# Patient Record
Sex: Male | Born: 1975 | Race: White | Hispanic: No | Marital: Married | Smoking: Current some day smoker
Health system: Southern US, Community
[De-identification: ages and names within clinical notes are randomized; demographics above are authoritative.]

## PROBLEM LIST (undated history)

## (undated) DIAGNOSIS — H2010 Chronic iridocyclitis, unspecified eye: Secondary | ICD-10-CM

## (undated) DIAGNOSIS — E7800 Pure hypercholesterolemia, unspecified: Secondary | ICD-10-CM

## (undated) DIAGNOSIS — I10 Essential (primary) hypertension: Secondary | ICD-10-CM

## (undated) DIAGNOSIS — J302 Other seasonal allergic rhinitis: Secondary | ICD-10-CM

## (undated) DIAGNOSIS — K219 Gastro-esophageal reflux disease without esophagitis: Secondary | ICD-10-CM

## (undated) HISTORY — PX: APPENDECTOMY: SHX54

## (undated) HISTORY — PX: KNEE SURGERY: SHX244

---

## 1999-06-16 ENCOUNTER — Emergency Department (HOSPITAL_COMMUNITY): Admission: EM | Admit: 1999-06-16 | Discharge: 1999-06-17 | Payer: Self-pay

## 1999-09-20 ENCOUNTER — Encounter (INDEPENDENT_AMBULATORY_CARE_PROVIDER_SITE_OTHER): Payer: Self-pay | Admitting: Specialist

## 1999-09-20 ENCOUNTER — Inpatient Hospital Stay (HOSPITAL_COMMUNITY): Admission: EM | Admit: 1999-09-20 | Discharge: 1999-09-21 | Payer: Self-pay | Admitting: Emergency Medicine

## 1999-09-20 ENCOUNTER — Encounter: Payer: Self-pay | Admitting: Emergency Medicine

## 2000-05-13 ENCOUNTER — Emergency Department (HOSPITAL_COMMUNITY): Admission: EM | Admit: 2000-05-13 | Discharge: 2000-05-14 | Payer: Self-pay | Admitting: Emergency Medicine

## 2000-05-14 ENCOUNTER — Encounter: Payer: Self-pay | Admitting: Emergency Medicine

## 2001-06-30 ENCOUNTER — Emergency Department (HOSPITAL_COMMUNITY): Admission: EM | Admit: 2001-06-30 | Discharge: 2001-06-30 | Payer: Self-pay | Admitting: Emergency Medicine

## 2001-06-30 ENCOUNTER — Encounter: Payer: Self-pay | Admitting: Emergency Medicine

## 2002-12-23 ENCOUNTER — Emergency Department (HOSPITAL_COMMUNITY): Admission: EM | Admit: 2002-12-23 | Discharge: 2002-12-23 | Payer: Self-pay | Admitting: Emergency Medicine

## 2003-01-07 ENCOUNTER — Ambulatory Visit (HOSPITAL_BASED_OUTPATIENT_CLINIC_OR_DEPARTMENT_OTHER): Admission: RE | Admit: 2003-01-07 | Discharge: 2003-01-07 | Payer: Self-pay | Admitting: Orthopedic Surgery

## 2003-06-21 ENCOUNTER — Emergency Department (HOSPITAL_COMMUNITY): Admission: EM | Admit: 2003-06-21 | Discharge: 2003-06-21 | Payer: Self-pay | Admitting: Family Medicine

## 2003-08-28 ENCOUNTER — Inpatient Hospital Stay (HOSPITAL_COMMUNITY): Admission: EM | Admit: 2003-08-28 | Discharge: 2003-09-03 | Payer: Self-pay | Admitting: *Deleted

## 2003-10-16 ENCOUNTER — Emergency Department (HOSPITAL_COMMUNITY): Admission: EM | Admit: 2003-10-16 | Discharge: 2003-10-16 | Payer: Self-pay | Admitting: Emergency Medicine

## 2004-03-26 ENCOUNTER — Emergency Department (HOSPITAL_COMMUNITY): Admission: EM | Admit: 2004-03-26 | Discharge: 2004-03-26 | Payer: Self-pay | Admitting: Family Medicine

## 2004-08-19 ENCOUNTER — Emergency Department (HOSPITAL_COMMUNITY): Admission: EM | Admit: 2004-08-19 | Discharge: 2004-08-19 | Payer: Self-pay | Admitting: Emergency Medicine

## 2007-02-06 ENCOUNTER — Emergency Department (HOSPITAL_COMMUNITY): Admission: EM | Admit: 2007-02-06 | Discharge: 2007-02-06 | Payer: Self-pay | Admitting: Emergency Medicine

## 2009-02-08 ENCOUNTER — Emergency Department (HOSPITAL_COMMUNITY): Admission: EM | Admit: 2009-02-08 | Discharge: 2009-02-08 | Payer: Self-pay | Admitting: Emergency Medicine

## 2009-04-02 ENCOUNTER — Emergency Department (HOSPITAL_COMMUNITY): Admission: EM | Admit: 2009-04-02 | Discharge: 2009-04-02 | Payer: Self-pay | Admitting: Emergency Medicine

## 2009-10-25 ENCOUNTER — Emergency Department (HOSPITAL_COMMUNITY): Admission: EM | Admit: 2009-10-25 | Discharge: 2009-10-25 | Payer: Self-pay | Admitting: Emergency Medicine

## 2009-10-27 ENCOUNTER — Ambulatory Visit: Payer: Self-pay | Admitting: Internal Medicine

## 2009-10-27 DIAGNOSIS — K219 Gastro-esophageal reflux disease without esophagitis: Secondary | ICD-10-CM | POA: Insufficient documentation

## 2009-10-27 DIAGNOSIS — R109 Unspecified abdominal pain: Secondary | ICD-10-CM | POA: Insufficient documentation

## 2009-10-27 DIAGNOSIS — R51 Headache: Secondary | ICD-10-CM

## 2009-10-27 DIAGNOSIS — R519 Headache, unspecified: Secondary | ICD-10-CM | POA: Insufficient documentation

## 2009-10-27 DIAGNOSIS — I10 Essential (primary) hypertension: Secondary | ICD-10-CM | POA: Insufficient documentation

## 2009-10-27 DIAGNOSIS — Z87442 Personal history of urinary calculi: Secondary | ICD-10-CM

## 2010-03-07 NOTE — Assessment & Plan Note (Signed)
Summary: NEW PT TO ESTABLISH WITH ABDOMINAL PAIN//SLM   Vital Signs:  Patient profile:   35 year old male Height:      73.5 inches Weight:      267 pounds BMI:     34.87 Temp:     98.8 degrees F oral Pulse rate:   84 / minute Pulse rhythm:   regular Resp:     20 per minute BP sitting:   150 / 122  (left arm) Cuff size:   large  Vitals Entered By: Duard Brady LPN (October 27, 2009 2:08 PM) CC: new to establish - BP issues Is Patient Diabetic? No   CC:  new to establish - BP issues.  History of Present Illness: 35 year old patient who is seen today to establish with a practice.  Four days ago.  He was seen at an urgent care center due to lower abdominal pain.  He states that he is completing two antibiotics at this time.  Two days ago, [facility] to persistent lower abdominal [facility].  He is status post remote appendectomy at age 17.  Earlier today.  He states that the pain has improved.  Denies any nausea, vomiting, or fever. He does have a long history of hypertension, but has not been taking his medication regularly due to the abdominal pain.  He also has a history of kidney stones.  He states it is also followed by ophthalmology due to iritis or uveitis  Preventive Screening-Counseling & Management  Alcohol-Tobacco     Smoking Cessation Counseling: yes  Allergies (verified): No Known Drug Allergies  Past History:  Past Medical History: GERD Headache Hypertension Nephrolithiasis, hx of history of uveitis or iritis  Past Surgical History: appendectomy age 57  right knee arthroscopic surgery  Family History: Reviewed history and no changes required. father mid-60s with hypertension mother early 44s with type 2 diabetes  one half brother in good health one sister stepsister  Social History: Reviewed history and no changes required. 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  The patient denies anorexia, fever, weight loss, weight gain, vision loss, decreased hearing, hoarseness, chest pain, syncope, dyspnea on exertion, peripheral edema, prolonged cough, headaches, hemoptysis, melena, hematochezia, severe indigestion/heartburn, hematuria, incontinence, genital sores, muscle weakness, suspicious skin lesions, transient blindness, difficulty walking, depression, unusual weight change, abnormal bleeding, enlarged lymph nodes, angioedema, breast masses, and testicular masses.    Physical Exam  General:  overweight-appearing.  160/110.  No acute distress Head:  Normocephalic and atraumatic without obvious abnormalities. No apparent alopecia or balding. Eyes:  No corneal or conjunctival inflammation noted. EOMI. Perrla. Funduscopic exam benign, without hemorrhages, exudates or papilledema. Vision grossly normal. Ears:  External ear exam shows no significant lesions or deformities.  Otoscopic examination reveals clear canals, tympanic membranes are intact bilaterally without bulging, retraction, inflammation or discharge. Hearing is grossly normal bilaterally. Nose:  External nasal examination shows no deformity or inflammation. Nasal mucosa are pink and moist without lesions or exudates. Mouth:  Oral mucosa and oropharynx without lesions or exudates.  Teeth in good repair. Neck:  No deformities, masses, or tenderness noted. Chest Wall:  No deformities, masses, tenderness or gynecomastia noted. Breasts:  No masses or gynecomastia noted Lungs:  Normal respiratory effort, chest expands symmetrically. Lungs are clear to auscultation, no crackles or wheezes. Heart:  Normal rate  and regular rhythm. S1 and S2 normal without gallop, murmur, click, rub or other extra sounds. Abdomen:  Bowel sounds positive,abdomen soft and non-tender without masses, organomegaly or hernias noted. very mild tenderness in  the lower abdominal quadrants without guarding Genitalia:  Testes bilaterally descended without nodularity, tenderness or masses. No scrotal masses or lesions. No penis lesions or urethral discharge. Msk:  No deformity or scoliosis noted of thoracic or lumbar spine.   Pulses:  R and L carotid,radial,femoral,dorsalis pedis and posterior tibial pulses are full and equal bilaterally Extremities:  No clubbing, cyanosis, edema, or deformity noted with normal full range of motion of all joints.   Neurologic:  No cranial nerve deficits noted. Station and gait are normal. Plantar reflexes are down-going bilaterally. DTRs are symmetrical throughout. Sensory, motor and coordinative functions appear intact. Skin:  Intact without suspicious lesions or rashes Cervical Nodes:  No lymphadenopathy noted Axillary Nodes:  No palpable lymphadenopathy Inguinal Nodes:  No significant adenopathy Psych:  Cognition and judgment appear intact. Alert and cooperative with normal attention span and concentration. No apparent delusions, illusions, hallucinations   Impression & Recommendations:  Problem # 1:  ABDOMINAL PAIN (ICD-789.00) the patient is much improved today; will clinically observed and placed on Align one daily for two weeks  Problem # 2:  GERD (ICD-530.81)  His updated medication list for this problem includes:    Prilosec Otc 20 Mg Tbec (Omeprazole magnesium) ..... Qd  Problem # 3:  HYPERTENSION (ICD-401.9)  His updated medication list for this problem includes:    Lisinopril-hydrochlorothiazide 20-25 Mg Tabs (Lisinopril-hydrochlorothiazide) ..... One daily compliance stressed.  Will reassess in  3 weeks; home blood pressure monitoring encouraged  Complete Medication List: 1)  Htn Med - Pt Does Not Know Name  2)  Prilosec Otc 20 Mg Tbec (Omeprazole magnesium) .... Qd 3)  Otc Allergy Med - Does Not Know Name  4)  2 Abx - Does Not Know Name  5)  Vicodin  6)  Lisinopril-hydrochlorothiazide 20-25  Mg Tabs (Lisinopril-hydrochlorothiazide) .... One daily  Patient Instructions: 1)  Please schedule a follow-up appointment in 2 weeks. 2)  Limit your Sodium (Salt). 3)  Tobacco is very bad for your health and your loved ones! You Should stop smoking!. 4)  It is important that you exercise regularly at least 20 minutes 5 times a week. If you develop chest pain, have severe difficulty breathing, or feel very tired , stop exercising immediately and seek medical attention. 5)  You need to lose weight. Consider a lower calorie diet and regular exercise.  6)  Check your Blood Pressure regularly. If it is above:  160/90 you should make an appointment. Prescriptions: LISINOPRIL-HYDROCHLOROTHIAZIDE 20-25 MG TABS (LISINOPRIL-HYDROCHLOROTHIAZIDE) one daily  #90 x 4   Entered and Authorized by:   Gordy Savers  MD   Signed by:   Gordy Savers  MD on 10/27/2009   Method used:   Electronically to        [facility]  [phone]* (retail)       [address]       [place], [place]  [address]       Ph: [phone] or [phone]       Fax: [phone]   RxID:   [id number]

## 2010-04-20 LAB — URINALYSIS, ROUTINE W REFLEX MICROSCOPIC
Bilirubin Urine: NEGATIVE
Glucose, UA: NEGATIVE mg/dL
Hgb urine dipstick: NEGATIVE
Ketones, ur: NEGATIVE mg/dL
Nitrite: NEGATIVE
Protein, ur: NEGATIVE mg/dL
Specific Gravity, Urine: 1.018 (ref 1.005–1.030)
Urobilinogen, UA: 0.2 mg/dL (ref 0.0–1.0)
pH: 6.5 (ref 5.0–8.0)

## 2010-04-20 LAB — GLUCOSE, CAPILLARY: Glucose-Capillary: 142 mg/dL — ABNORMAL HIGH (ref 70–99)

## 2010-04-20 LAB — POCT I-STAT, CHEM 8
Creatinine, Ser: 1.1 mg/dL (ref 0.4–1.5)
HCT: 56 % — ABNORMAL HIGH (ref 39.0–52.0)
Hemoglobin: 19 g/dL — ABNORMAL HIGH (ref 13.0–17.0)
Potassium: 3.9 mEq/L (ref 3.5–5.1)
Sodium: 136 mEq/L (ref 135–145)
TCO2: 32 mmol/L (ref 0–100)

## 2010-06-23 NOTE — H&P (Signed)
NAME:  Philip Cox, Philip Cox                          ACCOUNT NO.:  [id number]   MEDICAL RECORD NO.:  [id number]                   PATIENT TYPE:  IPS   LOCATION:  0305                                 FACILITY:  [facility]   PHYSICIAN:  Jasmine Pang, M.D.              DATE OF BIRTH:  Nov 23, 1975   DATE OF ADMISSION:  08/28/2003  DATE OF DISCHARGE:                         PSYCHIATRIC ADMISSION ASSESSMENT   IDENTIFYING INFORMATION:  This is a voluntary admission.  This is a 35-year-  old married white male. Apparently he presented to the emergency room at  [facility] on July 22.  At the time, he was reporting  suicidal and homicidal ideation stemming from this poly substance drug  abuse.  He was medically cleared.  A bed was not available on our unit at  the time and he decided to wait for a bed to be available here.  Apparently  he lost his job last Sunday due to his polysubstance abuse.  He has been  having marital conflict as well.  He states today that he has been spending  too much money and he needs to stop.  He denies any underlying depressive  symptoms.  He states that after surgery on his right knee 7-10 months ago he  liked Percocet and he continued to buy it from the street.   PAST PSYCHIATRIC HISTORY:  He has no prior inpatient or outpatient history.   SOCIAL HISTORY:  He finished the 9th grade.  He has been employed as a  Leisure centre manager.  He has been married once for 5 years and he has one son  age 36.   FAMILY HISTORY:  He denies.   ALCOHOL AND DRUG ABUSE:  He smokes 1 pack a day.  He began drinking wine  about age 17 or 67 and he reports drinking 1 bottle of wine almost daily for  the past year.  Crack cocaine began at age 63.  He uses $40-$50 worth 4-5  times a week for the past year.  Opiates in the past year 4-5 pills 2 to 3  times a day and this has been going on again for about a year, and benzos.  He began using those 4-5 days ago, again 4-5 pills 2-3 times  a day, and he  has used that as available.  Longest sobriety was for about a year and he  has been using since age 57.   PAST MEDICAL HISTORY:  He does not have one.  He says he does have a  Medicaid card and he is waiting for a Ola Fawver.  In the emergency room they  felt that he might have acid reflux and they prescribed Nexium.   ALLERGIES:  No known drug allergies.   POSITIVE PHYSICAL FINDINGS:  PHYSICAL EXAMINATION:  As per the documentation  on the chart from the emergency room, but notably he is status post  appendectomy.  He did have knee surgery on the right, and he has had kidney  stones in the past.   MENTAL STATUS EXAM:  He is alert and oriented x3.  His behavior and  appearance:  He is neatly groomed and dressed.  His speech is normal rate,  rhythm and tone.  His mood is euthymic.  His affect is congruent.  Interestingly, he does not exhibit any symptoms for withdrawal.  Thought  processes are clear, rational and goal oriented.  Judgment and insight are  fair.  Concentration and memory are intact.  His intelligence is average.  He specifically denies suicidal or homicidal ideation and he has auditory or  visual or tactile hallucinations   ADMISSION DIAGNOSES:   AXIS I:  Polysubstance dependence.   AXIS II:  Deferred.   AXIS III:  Acid reflux.   AXIS IV:  Severe, occupational, economic, and problems related to the legal  system.  He has served time in jail previously for breaking and entering and  larceny.   PLAN:  Admit for stabilization and detox.  He was started on the Wytensin  and low-dose Librium protocol last evening.  We will continue to assess for  underlying need for an antidepressant.     Mickie Leonarda Salon, P.A.-C.               Jasmine Pang, M.D.    MD/MEDQ  D:  08/29/2003  T:  08/29/2003  Job:  [id number] [id number]

## 2010-06-23 NOTE — Op Note (Signed)
[name]  Patient:    [name], Philip Cox                       MRN: [id number] Proc. Date: 09/20/99 Adm. Date:  [id number] Attending:  Arlis Porta                           Operative Report  PREOPERATIVE DIAGNOSIS:  Acute appendicitis.  POSTOPERATIVE DIAGNOSIS:  Acute appendicitis.  PROCEDURE:  Laparoscopic appendectomy.  SURGEON:  Adolph Pollack, M.D.  ASSISTANT:  None.  ANESTHESIA:  General.  INDICATIONS:  This 35 year old male has had persistent right lower quadrant pain.  He was seen by the emergency department physician and noted to have some right lower quadrant tenderness and elevation of his white blood cell count.  A CT scan was suspicious for early acute appendicitis, and he is brought to the operating room.  DESCRIPTION OF PROCEDURE:  He is placed supine on the operating room table and a general anesthetic was administered.  The lower abdomen was shaved.  A Foley catheter was placed in the bladder.  His abdomen was then sterilely prepped and draped.  A subumbilical incision was made, incising the skin sharply, and then carrying this through subcutaneous tissue bluntly until the midline fascia was identified.  A 1.0 cm incision was made in the midline fascia.  The peritoneal cavity was entered bluntly and under direct vision.  A pursestring suture of #0 Vicryl was placed around the fascial edges.  The Hasson trocar was introduced into the peritoneal cavity.  The pneumoperitoneum created by insufflation of CO2 gas.  Next the laparoscope was introduced.  The appendix appeared to be fairly firm and edematous, and there were areas of injection in the midportion.  Two 5.0 mm trocars were placed into the left lower quadrant, one in the right upper quadrant.  The mesoappendix was grasped and the appendix was retracted anteriorly.  The mesoappendix was divided with a Harmonic scalpel.  The appendix was then removed from the  abdominal cavity.  The area was inspected and there was some bleeding from the staple line.  I very carefully used cautery and then hemoclips to control this.  I irrigated the right lower quadrant area with saline and evacuated most of it.  Next, I went ahead and removed the trocars and released the pneumoperitoneum. The subumbilical fascial defect was closed by tightening up and tying down the pursestring suture.  The skin incisions were then closed with #4-0 Monocryl subcuticular stitches, followed by Steri-Strips and sterile dressings.  He tolerated the procedure well without any apparent complications, and was taken to the recovery room in satisfactory condition. DD:  09/20/99 TD:  09/20/99 Job: [id number]

## 2010-06-23 NOTE — Op Note (Signed)
NAME:  Philip Cox, Philip Cox                          ACCOUNT NO.:  [id number]   MEDICAL RECORD NO.:  [id number]                   PATIENT TYPE:  AMB   LOCATION:  DSC                                  FACILITY:  [facility]   PHYSICIAN:  Loreta Ave, M.D.              DATE OF BIRTH:  1975/09/30   DATE OF PROCEDURE:  01/07/2003  DATE OF DISCHARGE:                                 OPERATIVE REPORT   PREOPERATIVE DIAGNOSIS:  Displaced bucket-handle tear, lateral meniscus with  locked knee, right.   POSTOPERATIVE DIAGNOSIS:  Displaced bucket-handle tear, lateral meniscus  with locked knee, right.  Chondromalacia lateral tibial plateau and patella.   PROCEDURE:  Right knee examination under anesthesia, arthroscopy,  chondroplasty of patella and lateral tibial plateau. Extensive partial  lateral meniscectomy.   SURGEON:  Loreta Ave, M.D.   ASSISTANT:  Arlys John D. Petrarca, P.A.-C.   ANESTHESIA:  General.   ESTIMATED BLOOD LOSS:  Minimal.   SPECIMENS:  None.   CULTURES:  None.   COMPLICATIONS:  None.   DRESSINGS:  Soft compressive.   DESCRIPTION OF PROCEDURE:  The patient was brought to the operating room and  placed on the operating table in the supine position.  After adequate  anesthesia had been obtained, right knee examined, soft block to full  extension.  Otherwise, good stability.  Tourniquet and leg holder applied,  leg prepped and draped in the usual sterile fashion. Three portals created;  1 superolateral, 1 each medial and lateral parapatellar, inflow catheter  introduced, standard arthroscope introduced, knee inspected.   Grade 2 and 3 chondromalacia beak of patella, debrided with a shaver to a  stable surface. Trochlea looked good and there was good tracking.  Medial  meniscus, medial compartment intact.  Cruciate ligaments intact, reactive  synovitis debrided.  Lateral compartment grade 2 changes on the plateau  debrided. Bucket-handle tear, posterior 2/3rds lateral  meniscus, chronic in  nature with intermeniscal tearing, displaced anteriorly, assessed and not  repairable.  The bucket-handle fragment removed and the margins of the  meniscus tapered in.  Most of the posterior half was removed, along with the  bucket-handle tear. Care was taken to remove remnants of the posterior horn  and the remaining meniscus tapered in smoothly __________ the anterior half.   At completion, the entire knee examined, no other findings appreciated.  Instruments and fluid removed.  Portals of knee injected with Marcaine.  Portals closed with 4-0 nylon. Sterile, compressive dressing was applied,  anesthesia was reversed, brought to the recovery room, tolerated surgery  well, no complications.                                               Loreta Ave, M.D.    DFM/MEDQ  D:  01/07/2003  T:  01/08/2003  Job:  [id number]

## 2010-06-23 NOTE — Discharge Summary (Signed)
NAME:  TYUS, KALLAM NO.:  [id number]   MEDICAL RECORD NO.:  [id number]                   PATIENT TYPE:  IPS   LOCATION:  0301                                 FACILITY:  [facility]   PHYSICIAN:  Jeanice Lim, M.D.              DATE OF BIRTH:  05-16-1975   DATE OF ADMISSION:  08/28/2003  DATE OF DISCHARGE:  09/03/2003                                 DISCHARGE SUMMARY   IDENTIFYING DATA:  This is a 35 year old married Caucasian male presenting  with suicidal and homicidal ideation from polysubstance abuse.  Medically  cleared.  Waited for bed to be available here.  Had lost his job last  Sunday.  Had marital conflict as well.  Spending too much money.   ALLERGIES:  No known drug allergies.   PHYSICAL EXAMINATION:  Within normal limits.  Neurologically nonfocal.   MEDICAL HISTORY:  Status post appendectomy, kidney stones, knee surgery,  history of acid reflux.   LABORATORY DATA:  Urine drug screen positive for benzodiazepines and  cocaine.  Alcohol level less than 5.  Otherwise, within normal limits.   MENTAL STATUS EXAM:  Alert, oriented, neatly groomed, dressed.  Speech  within normal limits.  Mood euthymic.  Affect congruent.  Thought processes  goal directed, rational.  No suicidal or homicidal ideation.  No psychotic  symptoms.  Cognition intact.  Judgment and insight fair.   ADMISSION DIAGNOSES:   AXIS I:  1. Polysubstance abuse.  2. 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   AXIS II:  Deferred.   AXIS III:  Acid reflux.   AXIS IV:  Severe (occupational problems, economic problems, problems related  to legal system).   AXIS V:  45/55.   HOSPITAL COURSE:  The patient was admitted and ordered routine p.r.n.  medications and underwent further monitoring.  Was encouraged to participate  in individual, group and milieu therapy.  The patient was considered for a  Wytensin and low dose Librium detox from opiates and benzodiazepines.   The  patient had been using opiates, taking many pills daily, and alcohol, up to  a case of beer per day.  Drug of choice was cocaine.  Had been also taking  Xanax and Klonopin, having polysubstance abuse for a year now.  History for  a detox previously.  Was living with wife and child.  Had some cravings.  Fleeting suicidal thoughts and tolerated detox without complications.  Reporting improvement in mood.  Decrease in withdrawal symptoms.  Resolution  of dangerous ideation.  Improvement in mood and motivation to be abstinent  and follow up with the aftercare plan and participate in aftercare planning.   CONDITION ON DISCHARGE:  He was discharged in improved condition with no  mood instability and no acute withdrawal symptoms.  No dangerous ideation.  No psychotic symptoms.  Given medication education.   DISCHARGE MEDICATIONS:  Protonix 40 mg q.a.m.   FOLLOW UP:  The patient was to follow up with the [facility], walk-in  Monday through Friday 9 a.m. to 5 p.m.  Attend regular NA and AA meetings  and follow up for an outpatient assessment.   DISCHARGE DIAGNOSES:   AXIS I:  1. Polysubstance abuse.  2. 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   AXIS II:  Deferred.   AXIS III:  Acid reflux.   AXIS IV:  Severe (occupational problems, economic problems, problems related  to legal system).   AXIS V:  Global Assessment of Functioning on discharge 55.                                               Jeanice Lim, M.D.    JEM/MEDQ  D:  09/26/2003  T:  09/26/2003  Job:  [id number]

## 2010-06-23 NOTE — H&P (Signed)
[place]  Patient:    Philip Cox, Philip Cox                       MRN: [id number] Adm. Date:  [id number] Attending:  Arlis Porta                         History and Physical  CHIEF COMPLAINT: Right lower quadrant abdominal pain.  HISTORY OF PRESENT ILLNESS: This is a 35 year old male who at 11:00 on the morning of September 19, 1999 had the gradual onset of right lower quadrant and lower abdominal pain. He stated it felt like gas, but it persisted. He took three Excedrin and got some mild relief, but continued to have the pain and was concerned. He has had a kidney stone in the past, but this pain is nothing like the kidney stone pain. He presented to the emergency department and was evaluated by the emergency department physician. He was noted to have right lower quadrant tenderness and elevation of white blood cell count. CT scan of his abdomen was ordered. It demonstrated that his appendix was at the upper limits of normal with respect to its diameter, and also had some fluid in it. These findings were suggestive of early acute appendicitis. I was asked to see him because of this.  PAST MEDICAL HISTORY: Chronic lower back pain.  PREVIOUS OPERATIONS: None.  ALLERGIES: None.  MEDICATIONS: Vicodin p.r.n back pain.  SOCIAL HISTORY: No tobacco use. Occasional alcohol use.  FAMILY HISTORY: No chronic illnesses in the family.  REVIEW OF SYSTEMS: CARDIOVASCULAR: No hypertension, no coronary artery disease. PULMONARY: No asthma, pneumonia, TB. GASTROINTESTINAL: No peptic ulcer disease or hepatitis. ENDOCRINE: No diabetes. NEUROLOGIC: No seizure disorder. HEMATOLOGIC: No bleeding disorders, transfusions or deep venous thrombosis.  PHYSICAL EXAMINATION:  GENERAL: Slightly obese male in no acute distress. Pleasant and cooperative.  VITAL SIGNS: Temperature 98.1 with normal blood pressure and heart rate.  SKIN: Warm and dry without  jaundice.  EYES: Extraocular motions intact. Sclerae clear.  NECK: Supple without palpable mass.  CARDIOVASCULAR: Heart demonstrates a regular rate and rhythm without murmur.  EXTREMITIES: No lower extremity edema.  RESPIRATORY: Breath sounds equal and clear. Respirations not labored.  ABDOMEN: Soft and obese. Tenderness in the right lower quadrant to deep palpation. He does not have any referred signs. A few bowel sounds are present.  GENITOURINARY: Both testicles are descended. No masses, no tenderness, no penile lesions.  RECTAL: Normal sphincter tone, no masses, no blood in the rectum, no tenderness.  LABORATORY: Urinalysis within normal limits.  White blood cell count 13,400.  IMPRESSION: Persistent right lower quadrant pain and leukocytosis, and a CT scan suggesting early appendicitis. He does not have any associated symptoms. He does not appear to have obvious peritoneal signs on examination; however, he does have findings that are consistent with an early appendicitis.  PLAN: I recommend that he undergo laparoscopy and possible appendectomy. I explained to him the procedure and the risks include but are not limited to; bleeding, infection, accidental intra-abdominal damage, and the risk of anesthesia. I did explain to him the risk of progressive appendicitis and rupture which is much greater than his operative risks. I told him that there were findings suggestive that he had a deep appendicitis and I felt that further investigation was warranted. He seems to understand this and is willing to proceed. DD:  09/20/99 TD:  09/20/99 Job: [id number]

## 2010-07-18 ENCOUNTER — Encounter: Payer: Self-pay | Admitting: Internal Medicine

## 2010-07-18 ENCOUNTER — Ambulatory Visit (INDEPENDENT_AMBULATORY_CARE_PROVIDER_SITE_OTHER): Payer: Self-pay | Admitting: Internal Medicine

## 2010-07-18 VITALS — BP 110/70 | Temp 98.0°F | Ht 75.0 in | Wt 275.0 lb

## 2010-07-18 DIAGNOSIS — G5601 Carpal tunnel syndrome, right upper limb: Secondary | ICD-10-CM

## 2010-07-18 DIAGNOSIS — G5600 Carpal tunnel syndrome, unspecified upper limb: Secondary | ICD-10-CM

## 2010-07-18 DIAGNOSIS — I10 Essential (primary) hypertension: Secondary | ICD-10-CM

## 2010-07-18 MED ORDER — HYDROCODONE-ACETAMINOPHEN 5-500 MG PO TABS: 1.0000 | ORAL_TABLET | ORAL | Status: DC | PRN

## 2010-07-18 MED ORDER — LISINOPRIL-HYDROCHLOROTHIAZIDE 20-12.5 MG PO TABS: 1.0000 | ORAL_TABLET | Freq: Every day | ORAL | Status: DC

## 2010-07-18 NOTE — Progress Notes (Signed)
  Subjective:    Patient ID: Philip Cox, male    DOB: 05/15/1975, 35 y.o.   MRN: [id number]  HPI  35 year old patient who has treated hypertension. He presents with a five-month history of pain involving his right hand and arm and numbness involving digits one through 4. 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 Over the past 2 weeks symptoms have intensified.    Review of Systems  Musculoskeletal: Positive for arthralgias.       Objective:   Physical Exam  Constitutional: He appears well-developed and well-nourished. No distress.       Overweight blood pressure 118/78  Musculoskeletal:       Positive Tinel's Normal grip strength Hypesthesia over the medial nerve distribution sparing the fifth finger          Assessment & Plan:  Hypertension well controlled Right carpal tunnel syndrome  Medications refilled. He be placed on a wrist brace and anti-inflammatories. He will call if unimproved

## 2010-07-18 NOTE — Patient Instructions (Signed)
Aleve 2 twice daily Use a wrist brace on the right  Call or return to clinic prn if these symptoms worsen or fail to improve as anticipated.  Please check your blood pressure on a regular basis.  If it is consistently greater than 150/90, please make an office appointment.  Limit your sodium (Salt) intake  Return in 6 months for follow-up

## 2011-01-09 ENCOUNTER — Encounter (HOSPITAL_COMMUNITY): Payer: Self-pay

## 2011-01-09 ENCOUNTER — Emergency Department (HOSPITAL_COMMUNITY)
Admission: EM | Admit: 2011-01-09 | Discharge: 2011-01-09 | Disposition: A | Discharge status: Home / Self Care | Payer: Self-pay | Attending: Emergency Medicine | Admitting: Emergency Medicine

## 2011-01-09 DIAGNOSIS — H5711 Ocular pain, right eye: Secondary | ICD-10-CM

## 2011-01-09 DIAGNOSIS — H109 Unspecified conjunctivitis: Secondary | ICD-10-CM | POA: Insufficient documentation

## 2011-01-09 DIAGNOSIS — H5710 Ocular pain, unspecified eye: Secondary | ICD-10-CM | POA: Insufficient documentation

## 2011-01-09 DIAGNOSIS — E7800 Pure hypercholesterolemia, unspecified: Secondary | ICD-10-CM | POA: Insufficient documentation

## 2011-01-09 DIAGNOSIS — I10 Essential (primary) hypertension: Secondary | ICD-10-CM | POA: Insufficient documentation

## 2011-01-09 HISTORY — DX: Chronic iridocyclitis, unspecified eye: H20.10

## 2011-01-09 HISTORY — DX: Other seasonal allergic rhinitis: J30.2

## 2011-01-09 HISTORY — DX: Essential (primary) hypertension: I10

## 2011-01-09 HISTORY — DX: Pure hypercholesterolemia, unspecified: E78.00

## 2011-01-09 MED ORDER — TRAMADOL HCL 50 MG PO TABS: 50.0000 mg | ORAL_TABLET | Freq: Four times a day (QID) | ORAL | Status: AC | PRN

## 2011-01-09 MED ORDER — TOBRAMYCIN 0.3 % OP SOLN
2.0000 [drp] | Freq: Four times a day (QID) | OPHTHALMIC | Status: DC
  Administered 2011-01-09: 2 [drp] via OPHTHALMIC
  Filled 2011-01-09: qty 5

## 2011-01-09 NOTE — ED Notes (Signed)
[  Charted on incorrect pt]

## 2011-01-09 NOTE — ED Provider Notes (Signed)
History     CSN: [id number] Arrival date & time: 01/09/2011  1:39 PM   First MD Initiated Contact with Patient 01/09/11 1422      Chief Complaint  Patient presents with  . Eye Pain  Pt complains of R eye pain for the past week, notes mild blurred vision, redness.  Pt complains of photophobia.  No significant drainage.  Pt notes significantly worsening pain last night.    (Consider location/radiation/quality/duration/timing/severity/associated sxs/prior treatment) The history is provided by the patient.    Past Medical History  Diagnosis Date  . Iritis, chronic   . Hypertension   . Hypercholesteremia   . Seasonal allergies     Past Surgical History  Procedure Date  . Appendectomy   . Knee surgery     right    No family history on file.  History  Substance Use Topics  . Smoking status: Current Some Day Smoker -- 5 years    Types: Cigarettes  . Smokeless tobacco: Never Used  . Alcohol Use: 4.8 oz/week    8 Shots of liquor per week      Review of Systems  Constitutional: Negative.  Negative for fever.  HENT: Negative for congestion.   Eyes: Positive for photophobia, pain, redness and visual disturbance. Negative for discharge.    Allergies  Review of patient's allergies indicates no known allergies.  Home Medications   Current Outpatient Rx  Name Route Sig Dispense Refill  . LISINOPRIL-HYDROCHLOROTHIAZIDE 20-12.5 MG PO TABS Oral Take 1 tablet by mouth daily. 90 tablet 4  . LORATADINE 10 MG PO TABS Oral Take 10 mg by mouth daily.      Carma Leaven M PLUS PO TABS Oral Take 1 tablet by mouth daily.      Marland Kitchen PANTOPRAZOLE SODIUM 20 MG PO TBEC Oral Take 20 mg by mouth daily.      Marland Kitchen PRAVASTATIN SODIUM 40 MG PO TABS Oral Take 20 mg by mouth at bedtime.      Marland Kitchen FEXOFENADINE HCL 180 MG PO TABS Oral Take 180 mg by mouth daily.      Marland Kitchen HYDROCODONE-ACETAMINOPHEN 5-500 MG PO TABS Oral Take 1 tablet by mouth every 4 (four) hours as needed for pain. 20 tablet 0    BP 153/88   Pulse 99  Temp(Src) 97.8 F (36.6 C) (Oral)  Resp 24  Ht 6\' 3"  (1.905 m)  Wt 300 lb (136.079 kg)  BMI 37.50 kg/m2  SpO2 100%  Physical Exam  Constitutional: He is oriented to person, place, and time. He appears well-developed and well-nourished.  HENT:  Head: Normocephalic.  Eyes: Lids are normal. Pupils are equal, round, and reactive to light. No foreign bodies found. Right eye exhibits no exudate and no hordeolum. No foreign body present in the right eye. Left eye exhibits no discharge, no exudate and no hordeolum. No foreign body present in the left eye. Right conjunctiva is injected. Right conjunctiva has no hemorrhage. Left conjunctiva is not injected. Left conjunctiva has no hemorrhage. Right eye exhibits normal extraocular motion. Left eye exhibits normal extraocular motion.       Moderate injection to the R eye.  Acuity 20/70 in the R eye, 20/20 in the L eye without correction.  R eye examined under woods lamp.  No uptake, abrasions, ulcerations.   Neurological: He is alert and oriented to person, place, and time.  Skin: Skin is warm and dry.    ED Course  Procedures (including critical care time)  Labs Reviewed - No  data to display No results found.   No diagnosis found.    MDM  Tobramycin optho 2 drops to the R eye, to go home with remainder.  Pt to follow up with optho if not improved in 2-3 days.          Achilles Dunk [place], [place] 01/10/11 [phone]

## 2011-01-09 NOTE — ED Notes (Signed)
Pt denies eye injury- c/o of rt eye pain x 2 weeks- sensitivity to light.  Pt reports having this condition before and uses  Eye drops to help with pain- increased pain today

## 2011-01-10 NOTE — ED Provider Notes (Signed)
Medical screening examination/treatment/procedure(s) were performed by non-physician practitioner and as supervising physician I was immediately available for consultation/collaboration.  Evan Osburn, MD 01/10/11 0109 

## 2011-01-17 ENCOUNTER — Ambulatory Visit: Payer: Self-pay | Admitting: Internal Medicine

## 2011-06-11 ENCOUNTER — Encounter (HOSPITAL_COMMUNITY): Payer: Self-pay | Admitting: Emergency Medicine

## 2011-06-11 ENCOUNTER — Emergency Department (HOSPITAL_COMMUNITY)
Admission: EM | Admit: 2011-06-11 | Discharge: 2011-06-11 | Disposition: A | Discharge status: Home / Self Care | Payer: Self-pay | Attending: Emergency Medicine | Admitting: Emergency Medicine

## 2011-06-11 DIAGNOSIS — I10 Essential (primary) hypertension: Secondary | ICD-10-CM | POA: Insufficient documentation

## 2011-06-11 DIAGNOSIS — E7800 Pure hypercholesterolemia, unspecified: Secondary | ICD-10-CM | POA: Insufficient documentation

## 2011-06-11 DIAGNOSIS — L237 Allergic contact dermatitis due to plants, except food: Secondary | ICD-10-CM

## 2011-06-11 DIAGNOSIS — L255 Unspecified contact dermatitis due to plants, except food: Secondary | ICD-10-CM | POA: Insufficient documentation

## 2011-06-11 DIAGNOSIS — T622X1A Toxic effect of other ingested (parts of) plant(s), accidental (unintentional), initial encounter: Secondary | ICD-10-CM | POA: Insufficient documentation

## 2011-06-11 DIAGNOSIS — F17200 Nicotine dependence, unspecified, uncomplicated: Secondary | ICD-10-CM | POA: Insufficient documentation

## 2011-06-11 MED ORDER — PREDNISONE 20 MG PO TABS
60.0000 mg | ORAL_TABLET | ORAL | Status: AC
  Administered 2011-06-11: 60 mg via ORAL
  Filled 2011-06-11: qty 3

## 2011-06-11 MED ORDER — PREDNISONE (PAK) 10 MG PO TABS: 10.0000 mg | ORAL_TABLET | Freq: Every day | ORAL | Status: AC

## 2011-06-11 MED ORDER — DIPHENHYDRAMINE HCL 25 MG PO TABS: 25.0000 mg | ORAL_TABLET | Freq: Four times a day (QID) | ORAL | Status: DC | PRN

## 2011-06-11 NOTE — ED Provider Notes (Signed)
History     CSN: [id number]  Arrival date & time 06/11/11  4098   First MD Initiated Contact with Patient 06/11/11 [phone]      Chief Complaint  Patient presents with  . Poison Ivy    (Consider location/radiation/quality/duration/timing/severity/associated sxs/prior treatment) HPI Comments: Patient reports he was cutting limbs and the woods three days ago when he noticed that he was clearing poison ivy, poison oak, and poison sumac.  States that soon after that he began to break out in a rash, starting behind his bilateral knees.  The rash has progressed to include his bilateral lower legs and his bilateral forearms. Has been using over-the-counter medication without relief.   Denies fevers, pain, discharge from the wounds.  Denies any itching or swelling in his mouth or throat, difficulty swallowing or breathing.  Patient is a 36 y.o. male presenting with Poison Ivy. The history is provided by the patient.  Poison Ivy Associated symptoms include a rash. Pertinent negatives include no chills, fever or sore throat.    Past Medical History  Diagnosis Date  . Iritis, chronic   . Hypertension   . Hypercholesteremia   . Seasonal allergies     Past Surgical History  Procedure Date  . Appendectomy   . Knee surgery     right    No family history on file.  History  Substance Use Topics  . Smoking status: Current Some Day Smoker -- 5 years    Types: Cigarettes  . Smokeless tobacco: Never Used  . Alcohol Use: 4.8 oz/week    8 Shots of liquor per week      Review of Systems  Constitutional: Negative for fever and chills.  HENT: Negative for sore throat, mouth sores and trouble swallowing.   Respiratory: Negative for shortness of breath.   Skin: Positive for rash.    Allergies  Review of patient's allergies indicates no known allergies.  Home Medications   Current Outpatient Rx  Name Route Sig Dispense Refill  . DIPHENHYDRAMINE-CALAMINE 2-14 % EX CREA Apply externally  Apply topically 2 (two) times daily.    Marland Kitchen LISINOPRIL-HYDROCHLOROTHIAZIDE 20-12.5 MG PO TABS Oral Take 1 tablet by mouth daily. 90 tablet 4  . LORATADINE 10 MG PO TABS Oral Take 10 mg by mouth daily.      Carma Leaven M PLUS PO TABS Oral Take 1 tablet by mouth daily.      Marland Kitchen PANTOPRAZOLE SODIUM 20 MG PO TBEC Oral Take 20 mg by mouth daily.      Marland Kitchen PRAVASTATIN SODIUM 40 MG PO TABS Oral Take 20 mg by mouth at bedtime.      Marland Kitchen ZINC ACETATE-BENZYL ALCOHOL 2-10 % EX CREA Apply externally Apply topically 2 (two) times daily.      BP 128/81  Pulse 75  Temp(Src) 98.1 F (36.7 C) (Oral)  SpO2 97%  Physical Exam  Nursing note and vitals reviewed. Constitutional: He is oriented to person, place, and time. He appears well-developed and well-nourished.  HENT:  Head: Normocephalic and atraumatic.  Neck: Neck supple.  Pulmonary/Chest: Effort normal.  Neurological: He is alert and oriented to person, place, and time.  Skin: Rash noted.       Vesicular rash over bilateral forearms, and bilateral lower legs.  Consistent with contact dermatitis.   No erythema, edema, discharge, warmth.    ED Course  Procedures (including critical care time)  Labs Reviewed - No data to display No results found.   1. Contact dermatitis due to poison  ivy       MDM  Patient with contact dermatitis from plants contracted three days ago.  There is no evidence of superinfection.  Patient is afebrile, nontoxic.  All vesicles are intact.  Patient is to continue using over-the-counter medications and to add prescribed prednisone and Benadryl for added relief.  Return precautions discussed verbally and given in discharge instructions.  Patient verbalizes understanding and agrees to plan.        Rise Patience, [place] 06/11/11 1049

## 2011-06-11 NOTE — Discharge Instructions (Signed)
Please read the information below.  Use the medications as prescribed.  If you develop fevers, increased redness or swelling, discharge from your rash, please see your doctor or return to the ER for a recheck.  You may return to the ER at any time for worsening condition or any new symptoms that concern you.   Contact Dermatitis Contact dermatitis is a reaction to certain substances that touch the skin. Contact dermatitis can be either irritant contact dermatitis or allergic contact dermatitis. Irritant contact dermatitis does not require previous exposure to the substance for a reaction to occur.Allergic contact dermatitis only occurs if you have been exposed to the substance before. Upon a repeat exposure, your body reacts to the substance.  CAUSES  Many substances can cause contact dermatitis. Irritant dermatitis is most commonly caused by repeated exposure to mildly irritating substances, such as:  Makeup.   Soaps.   Detergents.   Bleaches.   Acids.   Metal salts, such as nickel.  Allergic contact dermatitis is most commonly caused by exposure to:  Poisonous plants.   Chemicals (deodorants, shampoos).   Jewelry.   Latex.   Neomycin in triple antibiotic cream.   Preservatives in products, including clothing.  SYMPTOMS  The area of skin that is exposed may develop:  Dryness or flaking.   Redness.   Cracks.   Itching.   Pain or a burning sensation.   Blisters.  With allergic contact dermatitis, there may also be swelling in areas such as the eyelids, mouth, or genitals.  DIAGNOSIS  Your caregiver can usually tell what the problem is by doing a physical exam. In cases where the cause is uncertain and an allergic contact dermatitis is suspected, a patch skin test may be performed to help determine the cause of your dermatitis. TREATMENT Treatment includes protecting the skin from further contact with the irritating substance by avoiding that substance if possible.  Barrier creams, powders, and gloves may be helpful. Your caregiver may also recommend:  Steroid creams or ointments applied 2 times daily. For best results, soak the rash area in cool water for 20 minutes. Then apply the medicine. Cover the area with a plastic wrap. You can store the steroid cream in the refrigerator for a "chilly" effect on your rash. That may decrease itching. Oral steroid medicines may be needed in more severe cases.   Antibiotics or antibacterial ointments if a skin infection is present.   Antihistamine lotion or an antihistamine taken by mouth to ease itching.   Lubricants to keep moisture in your skin.   Burow's solution to reduce redness and soreness or to dry a weeping rash. Mix one packet or tablet of solution in 2 cups cool water. Dip a clean washcloth in the mixture, wring it out a bit, and put it on the affected area. Leave the cloth in place for 30 minutes. Do this as often as possible throughout the day.   Taking several cornstarch or baking soda baths daily if the area is too large to cover with a washcloth.  Harsh chemicals, such as alkalis or acids, can cause skin damage that is like a burn. You should flush your skin for 15 to 20 minutes with cold water after such an exposure. You should also seek immediate medical care after exposure. Bandages (dressings), antibiotics, and pain medicine may be needed for severely irritated skin.  HOME CARE INSTRUCTIONS  Avoid the substance that caused your reaction.   Keep the area of skin that is affected away  from hot water, soap, sunlight, chemicals, acidic substances, or anything else that would irritate your skin.   Do not scratch the rash. Scratching may cause the rash to become infected.   You may take cool baths to help stop the itching.   Only take over-the-counter or prescription medicines as directed by your caregiver.   See your caregiver for follow-up care as directed to make sure your skin is healing  properly.  SEEK MEDICAL CARE IF:   Your condition is not better after 3 days of treatment.   You seem to be getting worse.   You see signs of infection such as swelling, tenderness, redness, soreness, or warmth in the affected area.   You have any problems related to your medicines.  Document Released: 01/20/2000 Document Revised: 01/11/2011 Document Reviewed: 06/27/2010 [facility] Patient Information 2012 [place], [place].  Poison [employer] ivy is a inflammation of the skin (contact dermatitis) caused by touching the allergens on the leaves of the ivy plant following previous exposure to the plant. The rash usually appears 48 hours after exposure. The rash is usually bumps (papules) or blisters (vesicles) in a linear pattern. Depending on your own sensitivity, the rash may simply cause redness and itching, or it may also progress to blisters which may break open. These must be well cared for to prevent secondary bacterial (germ) infection, followed by scarring. Keep any open areas dry, clean, dressed, and covered with an antibacterial ointment if needed. The eyes may also get puffy. The puffiness is worst in the morning and gets better as the day progresses. This dermatitis usually heals without scarring, within 2 to 3 weeks without treatment. HOME CARE INSTRUCTIONS  Thoroughly wash with soap and water as soon as you have been exposed to poison ivy. You have about one half hour to remove the plant resin before it will cause the rash. This washing will destroy the oil or antigen on the skin that is causing, or will cause, the rash. Be sure to wash under your fingernails as any plant resin there will continue to spread the rash. Do not rub skin vigorously when washing affected area. Poison ivy cannot spread if no oil from the plant remains on your body. A rash that has progressed to weeping sores will not spread the rash unless you have not washed thoroughly. It is also important to wash any clothes you  have been wearing as these may carry active allergens. The rash will return if you wear the unwashed clothing, even several days later. Avoidance of the plant in the future is the best measure. Poison ivy plant can be recognized by the number of leaves. Generally, poison ivy has three leaves with flowering branches on a single stem. Diphenhydramine may be purchased over the counter and used as needed for itching. Do not drive with this medication if it makes you drowsy.Ask your caregiver about medication for children. SEEK MEDICAL CARE IF:  Open sores develop.   Redness spreads beyond area of rash.   You notice purulent (pus-like) discharge.   You have increased pain.   Other signs of infection develop (such as fever).  Document Released: 01/20/2000 Document Revised: 01/11/2011 Document Reviewed: 12/08/2008 [facility] Patient Information 2012 [place], [place].

## 2011-06-11 NOTE — ED Notes (Signed)
Pt reports getting into poison oak, poison ivy and poison sumac all at once on Friday on bilateral lower extremities and left arm. Pt report using Ivy-Dry spray and Ivarest cream otc for rash on lower extremities and left arm. Pt reports having to get "shots" as a child for waiting to long to treat poison Ivy.

## 2011-06-11 NOTE — ED Provider Notes (Signed)
Medical screening examination/treatment/procedure(s) were performed by non-physician practitioner and as supervising physician I was immediately available for consultation/collaboration.  Hurman Horn, MD 06/11/11 [phone]

## 2011-09-25 ENCOUNTER — Emergency Department (HOSPITAL_COMMUNITY)
Admission: EM | Admit: 2011-09-25 | Discharge: 2011-09-25 | Disposition: A | Discharge status: Home / Self Care | Payer: Medicaid Other | Attending: Emergency Medicine | Admitting: Emergency Medicine

## 2011-09-25 ENCOUNTER — Encounter (HOSPITAL_COMMUNITY): Payer: Self-pay | Admitting: *Deleted

## 2011-09-25 DIAGNOSIS — K219 Gastro-esophageal reflux disease without esophagitis: Secondary | ICD-10-CM | POA: Insufficient documentation

## 2011-09-25 DIAGNOSIS — I10 Essential (primary) hypertension: Secondary | ICD-10-CM | POA: Insufficient documentation

## 2011-09-25 DIAGNOSIS — F17200 Nicotine dependence, unspecified, uncomplicated: Secondary | ICD-10-CM | POA: Insufficient documentation

## 2011-09-25 DIAGNOSIS — Z9109 Other allergy status, other than to drugs and biological substances: Secondary | ICD-10-CM | POA: Insufficient documentation

## 2011-09-25 DIAGNOSIS — H2010 Chronic iridocyclitis, unspecified eye: Secondary | ICD-10-CM

## 2011-09-25 DIAGNOSIS — E7800 Pure hypercholesterolemia, unspecified: Secondary | ICD-10-CM | POA: Insufficient documentation

## 2011-09-25 HISTORY — DX: Gastro-esophageal reflux disease without esophagitis: K21.9

## 2011-09-25 MED ORDER — TOBRAMYCIN 0.3 % OP SOLN: 1.0000 [drp] | OPHTHALMIC | Status: AC

## 2011-09-25 MED ORDER — TRAMADOL HCL 50 MG PO TABS: 50.0000 mg | ORAL_TABLET | Freq: Four times a day (QID) | ORAL | Status: AC | PRN

## 2011-09-25 NOTE — ED Notes (Addendum)
Rt eye pain for 3 weeks, has history of Iritis

## 2011-09-25 NOTE — ED Provider Notes (Signed)
History     CSN: [id number]  Arrival date & time 09/25/11  1614   First MD Initiated Contact with Patient 09/25/11 1713      Chief Complaint  Patient presents with  . Eye Pain    (Consider location/radiation/quality/duration/timing/severity/associated sxs/prior treatment) HPI Comments: Philip Cox 36 y.o. male   The chief complaint is: Patient presents with:   Eye Pain   The patient has medical history significant for:   Past Medical History:   Iritis, chronic                                              Hypertension                                                 Hypercholesteremia                                           Seasonal allergies                                           GERD (gastroesophageal reflux disease)                       Patient presents with iritis patient has history of 4-5 years. Patient states that he has had right eye pain for three weeks. Associated symptoms include photophobia and blurry vision. Patient states in the past he used artificial tears with resolution of symptoms. Denies fever, chills, night sweats. Denies NVD or abdominal pain. Denies CP,SOB, palpitations. Denies headache or diplopia.    Patient is a 36 y.o. male presenting with eye pain. The history is provided by the patient.  Eye Pain Pertinent negatives include no abdominal pain, chest pain, chills, diaphoresis, fever, headaches, nausea or vomiting.    Past Medical History  Diagnosis Date  . Iritis, chronic   . Hypertension   . Hypercholesteremia   . Seasonal allergies   . GERD (gastroesophageal reflux disease)     Past Surgical History  Procedure Date  . Appendectomy   . Knee surgery     right    No family history on file.  History  Substance Use Topics  . Smoking status: Current Some Day Smoker -- 5 years    Types: Cigarettes  . Smokeless tobacco: Never Used  . Alcohol Use: 0.0 oz/week      Review of Systems  Constitutional: Negative for fever,  chills and diaphoresis.  Eyes: Positive for photophobia, pain, redness and visual disturbance.  Respiratory: Negative for shortness of breath.   Cardiovascular: Negative for chest pain and palpitations.  Gastrointestinal: Negative for nausea, vomiting, abdominal pain and diarrhea.  Neurological: Negative for headaches.  All other systems reviewed and are negative.    Allergies  Review of patient's allergies indicates no known allergies.  Home Medications   Current Outpatient Rx  Name Route Sig Dispense Refill  . LISINOPRIL-HYDROCHLOROTHIAZIDE 20-12.5 MG PO TABS Oral Take 1 tablet by mouth daily. 90 tablet 4  .  LORATADINE 10 MG PO TABS Oral Take 10 mg by mouth daily.      Carma Leaven M PLUS PO TABS Oral Take 1 tablet by mouth daily.      . OMEGA-3-ACID ETHYL ESTERS 1 G PO CAPS Oral Take 1 g by mouth 2 (two) times daily.    Marland Kitchen PANTOPRAZOLE SODIUM 20 MG PO TBEC Oral Take 20 mg by mouth daily.      Marland Kitchen PRAVASTATIN SODIUM 40 MG PO TABS Oral Take 20 mg by mouth at bedtime.      Marland Kitchen DIPHENHYDRAMINE HCL 25 MG PO TABS Oral Take 1 tablet (25 mg total) by mouth every 6 (six) hours as needed for itching. 20 tablet 0    BP 114/75  Pulse 74  Temp 98.1 F (36.7 C) (Oral)  Resp 18  SpO2 100%  Physical Exam  Nursing note and vitals reviewed. Constitutional: He appears well-developed and well-nourished. No distress.  HENT:  Head: Normocephalic and atraumatic.  Mouth/Throat: Oropharynx is clear and moist.  Eyes: Conjunctivae and EOM are normal. Pupils are equal, round, and reactive to light. Right eye exhibits no discharge. Left eye exhibits no discharge. No scleral icterus.       Right eye has pronounced erythema and injection.  Neck: Normal range of motion. Neck supple.  Cardiovascular: Normal rate, regular rhythm and normal heart sounds.   Pulmonary/Chest: Effort normal and breath sounds normal.  Abdominal: Soft. Bowel sounds are normal. There is no tenderness.  Neurological: He is alert.  Skin:  Skin is warm and dry.    ED Course  Procedures (including critical care time)  Labs Reviewed - No data to display No results found.   1. Iritis, chronic        MDM  Patient presents for acute treatment of acute iritis. Patient discharged on pain medication and ABX opthalmic drops. Visual acuity: impaired 20/200 on right eye. Patient encouraged to seek care from opthalmology. Patient resource guide provided as he does not have insurance. No red flags for bacterial conjunctivitis or glaucoma. Return precautions given verbally and in discharge summary.         Pixie Casino, PA-C 09/26/11 0126

## 2011-09-25 NOTE — ED Notes (Signed)
MD at bedside. 

## 2011-09-28 NOTE — ED Provider Notes (Signed)
Medical screening examination/treatment/procedure(s) were performed by non-physician practitioner and as supervising physician I was immediately available for consultation/collaboration.  Celene Kras, MD 09/28/11 1500

## 2012-06-01 ENCOUNTER — Other Ambulatory Visit: Payer: Self-pay | Admitting: Family Medicine

## 2012-09-26 ENCOUNTER — Ambulatory Visit (INDEPENDENT_AMBULATORY_CARE_PROVIDER_SITE_OTHER): Payer: Medicaid Other | Admitting: Family Medicine

## 2012-09-26 ENCOUNTER — Encounter: Payer: Self-pay | Admitting: Family Medicine

## 2012-09-26 VITALS — BP 130/90 | HR 76 | Temp 98.1°F | Resp 20 | Ht 73.25 in | Wt 239.0 lb

## 2012-09-26 DIAGNOSIS — J029 Acute pharyngitis, unspecified: Secondary | ICD-10-CM

## 2012-09-26 DIAGNOSIS — I10 Essential (primary) hypertension: Secondary | ICD-10-CM

## 2012-09-26 DIAGNOSIS — J039 Acute tonsillitis, unspecified: Secondary | ICD-10-CM

## 2012-09-26 LAB — RAPID STREP SCREEN (MED CTR MEBANE ONLY): Streptococcus, Group A Screen (Direct): NEGATIVE

## 2012-09-26 MED ORDER — HYDROCODONE-ACETAMINOPHEN 5-325 MG PO TABS: 1.0000 | ORAL_TABLET | ORAL | Status: DC | PRN

## 2012-09-26 MED ORDER — AMOXICILLIN-POT CLAVULANATE 875-125 MG PO TABS: 1.0000 | ORAL_TABLET | Freq: Two times a day (BID) | ORAL | Status: DC

## 2012-09-26 NOTE — Patient Instructions (Addendum)
Take antibiotic as prescribed Push fluids Take ibuprofen over the counter for pain  Rest  F/U 4 weeks for blood pressure, come fasting to have labs rechecked Tonsillitis Tonsils are lumps of lymphoid tissues at the back of the throat. Each tonsil has 20 crevices (crypts). Tonsils help fight nose and throat infections and keep infection from spreading to other parts of the body for the first 18 months of life. Tonsillitis is an infection of the throat that causes the tonsils to become red, tender, and swollen. CAUSES Sudden and, if treated, temporary (acute) tonsillitis is usually caused by infection with streptococcal bacteria. Long lasting (chronic) tonsillitis occurs when the crypts of the tonsils become filled with pieces of food and bacteria, which makes it easy for the tonsils to become constantly infected. SYMPTOMS  Symptoms of tonsillitis include:  A sore throat.  White patches on the tonsils.  Fever.  Tiredness. DIAGNOSIS Tonsillitis can be diagnosed through a physical exam. Diagnosis can be confirmed with the results of lab tests, including a throat culture. TREATMENT  The goals of tonsillitis treatment include the reduction of the severity and duration of symptoms, prevention of associated conditions, and prevention of disease transmission. Tonsillitis caused by bacteria can be treated with antibiotics. Usually, treatment with antibiotics is started before the cause of the tonsillitis is known. However, if it is determined that the cause is not bacterial, antibiotics will not treat the tonsillitis. If attacks of tonsillitis are severe and frequent, your caregiver may recommend surgery to remove the tonsils (tonsillectomy). HOME CARE INSTRUCTIONS   Rest as much as possible and get plenty of sleep.  Drink plenty of fluids. While the throat is very sore, eat soft foods or liquids, such as sherbet, soups, or instant breakfast drinks.  Eat frozen ice pops.  Older children and  adults may gargle with a warm or cold liquid to help soothe the throat. Mix 1 teaspoon of salt in 1 cup of water.  Other family members who also develop a sore throat or fever should have a medical exam or throat culture.  Only take over-the-counter or prescription medicines for pain, discomfort, or fever as directed by your caregiver.  If you are given antibiotics, take them as directed. Finish them even if you start to feel better. SEEK MEDICAL CARE IF:   Your baby is older than 3 months with a rectal temperature of 100.5 F (38.1 C) or higher for more than 1 day.  Large, tender lumps develop in your neck.  A rash develops.  Green, yellow-brown, or bloody substance is coughed up.  You are unable to swallow liquids or food for 24 hours.  Your child is unable to swallow food or liquids for 12 hours. SEEK IMMEDIATE MEDICAL CARE IF:   You develop any new symptoms such as vomiting, severe headache, stiff neck, chest pain, or trouble breathing or swallowing.  You have severe throat pain along with drooling or voice changes.  You have severe pain, unrelieved with recommended medications.  You are unable to fully open the mouth.  You develop redness, swelling, or severe pain anywhere in the neck.  You have a fever.  Your baby is older than 3 months with a rectal temperature of 102 F (38.9 C) or higher.  Your baby is 36 months old or younger with a rectal temperature of 100.4 F (38 C) or higher. MAKE SURE YOU:   Understand these instructions.  Will watch your condition.  Will get help right away if you are  not doing well or get worse. Document Released: 11/01/2004 Document Revised: 04/16/2011 Document Reviewed: 03/30/2010 [facility] Patient Information 2014 [place], [place].

## 2012-09-26 NOTE — Progress Notes (Signed)
  Subjective:    Patient ID: Philip Cox, male    DOB: 01-10-1976, 37 y.o.   MRN: [id number]  HPI Pt here with sore throat, weakness,  Body aches and swelling of lymph nodes for past 48 hours. Wed began feeling sick, Thursday chills and sweats hit him. No sick contacts, nyquil and mucniex taken. Missed 2 days of work. HTN- stopped meds on his own 3 months ago, he has lost a significant amount of weight 330 down to 239lbs, wanted to see how BP did without meds. He does check at [employer] typically 120/ 80's   Review of Systems   GEN- + fatigue, fever, weight loss,weakness, recent illness HEENT- denies eye drainage, change in vision, nasal discharge, +sore throat CVS- denies chest pain, palpitations RESP- denies SOB, cough, wheeze ABD- denies N/V, change in stools, abd pain GU- denies dysuria, hematuria, dribbling, incontinence MSK- denies joint pain, +muscle aches, injury Neuro- denies headache, dizziness, syncope, seizure activity      Objective:   Physical Exam GEN- NAD, alert and oriented x3 HEENT- PERRL, EOMI, non injected sclera, pink conjunctiva, MMM, oropharynx +injection,+tonsilar enlargement, and exudates, TM clear bilat no effusion, no  maxillary sinus tenderness, inflammed turbinates,  Nasal drainage  Neck- Supple, + shotty bilateral LAD CVS- RRR, no murmur RESP-CTAB EXT- No edema Pulses- Radial 2+         Assessment & Plan:

## 2012-09-27 DIAGNOSIS — J039 Acute tonsillitis, unspecified: Secondary | ICD-10-CM | POA: Insufficient documentation

## 2012-09-27 NOTE — Assessment & Plan Note (Signed)
Treat with antibiotics, hydrocodone given for pain Salt water rinse Strep negative

## 2012-09-27 NOTE — Assessment & Plan Note (Signed)
Recheck BP in 4 weeks, when not acutely ill, repeat BP today improved, no meds on board

## 2012-10-27 ENCOUNTER — Ambulatory Visit (INDEPENDENT_AMBULATORY_CARE_PROVIDER_SITE_OTHER): Payer: Medicaid Other | Admitting: Family Medicine

## 2012-10-27 ENCOUNTER — Encounter: Payer: Self-pay | Admitting: Family Medicine

## 2012-10-27 VITALS — BP 138/90 | HR 68 | Temp 97.6°F | Resp 18 | Wt 245.0 lb

## 2012-10-27 DIAGNOSIS — Z23 Encounter for immunization: Secondary | ICD-10-CM

## 2012-10-27 DIAGNOSIS — E669 Obesity, unspecified: Secondary | ICD-10-CM

## 2012-10-27 DIAGNOSIS — I10 Essential (primary) hypertension: Secondary | ICD-10-CM

## 2012-10-27 LAB — COMPREHENSIVE METABOLIC PANEL
ALT: 14 U/L (ref 0–53)
Alkaline Phosphatase: 93 U/L (ref 39–117)
CO2: 31 mEq/L (ref 19–32)
Creat: 0.95 mg/dL (ref 0.50–1.35)
Glucose, Bld: 85 mg/dL (ref 70–99)
Sodium: 140 mEq/L (ref 135–145)
Total Bilirubin: 0.3 mg/dL (ref 0.3–1.2)
Total Protein: 7.1 g/dL (ref 6.0–8.3)

## 2012-10-27 LAB — CBC
HCT: 48.9 % (ref 39.0–52.0)
MCH: 29.2 pg (ref 26.0–34.0)
MCV: 85.6 fL (ref 78.0–100.0)
RBC: 5.71 MIL/uL (ref 4.22–5.81)
WBC: 6.7 10*3/uL (ref 4.0–10.5)

## 2012-10-27 LAB — LIPID PANEL
HDL: 51 mg/dL (ref >39)
LDL Cholesterol: 96 mg/dL (ref 0–99)
Total CHOL/HDL Ratio: 3.5 Ratio

## 2012-10-27 NOTE — Progress Notes (Signed)
  Subjective:    Patient ID: Philip Cox, male    DOB: 12-29-1975, 37 y.o.   MRN: [id number]  HPI  Patient here to followup hypertension. He actually stopped his blood pressure medications about 2-3 months ago. He's been trying to watch his weight and use his diet to control his blood pressure. He has no specific concerns today. He is due for fasting labs.  Review of Systems  GEN- denies fatigue, fever, weight loss,weakness, recent illness HEENT- denies eye drainage, change in vision, nasal discharge, CVS- denies chest pain, palpitations RESP- denies SOB, cough, wheeze Neuro- denies headache, dizziness, syncope, seizure activity      Objective:   Physical Exam GEN- NAD, alert and oriented x3 HEENT- PERRL, EOMI, non injected sclera, pink conjunctiva, MMM, oropharynx clear CVS- RRR, no murmur RESP-CTAB EXT- No edema Pulses- Radial 2+        Assessment & Plan:

## 2012-10-27 NOTE — Patient Instructions (Signed)
Check your blood pressure 1-2 times a month , call if BP consistently > 140/90 We will cal with results Flu shot given  F/U 6 months

## 2012-10-27 NOTE — Assessment & Plan Note (Signed)
We discussed hypertension and how to treat this with diet and exercise. I will obtain get in the next few months off this medication and he will check his blood pressures locally at the pharmacy. Of note he has gained 6 pounds since her last visit therefore we spent significant time discussing keeping his workout routine and making sure that he keeps up with his water intake and low sodium diet. His fasting labs will be checked today

## 2012-10-31 ENCOUNTER — Ambulatory Visit: Payer: Medicaid Other | Admitting: Family Medicine

## 2013-01-26 ENCOUNTER — Other Ambulatory Visit: Payer: Self-pay | Admitting: Family Medicine

## 2013-01-27 ENCOUNTER — Other Ambulatory Visit: Payer: Self-pay | Admitting: Family Medicine

## 2013-01-27 ENCOUNTER — Ambulatory Visit (INDEPENDENT_AMBULATORY_CARE_PROVIDER_SITE_OTHER): Payer: Medicaid Other | Admitting: Family Medicine

## 2013-01-27 ENCOUNTER — Encounter: Payer: Self-pay | Admitting: Family Medicine

## 2013-01-27 VITALS — BP 136/84 | HR 58 | Temp 97.1°F | Resp 16 | Ht 75.0 in | Wt 248.0 lb

## 2013-01-27 DIAGNOSIS — M25569 Pain in unspecified knee: Secondary | ICD-10-CM

## 2013-01-27 DIAGNOSIS — M25561 Pain in right knee: Secondary | ICD-10-CM

## 2013-01-27 MED ORDER — CETIRIZINE HCL 10 MG PO TABS: 10.0000 mg | ORAL_TABLET | Freq: Every day | ORAL | Status: DC

## 2013-01-27 MED ORDER — OXAPROZIN 600 MG PO TABS: 1200.0000 mg | ORAL_TABLET | Freq: Every day | ORAL | Status: DC

## 2013-01-27 NOTE — Progress Notes (Signed)
   Subjective:    Patient ID: Philip Cox, male    DOB: Oct 05, 1975, 37 y.o.   MRN: [id number]  HPI  Patient reports 2 weeks of left knee pain.  It is located over the medial joint line. It is worse with twisting movement. He denies any specific injury. He believes he gradually irritated with repetitive movements of his new job. It hurts with prolonged standing and walking. It is slightly tender to palpation over the medial joint line. He denies any erythema or effusion. He denies any locking in the knee joint. He denies any laxity in the joint. Past Medical History  Diagnosis Date  . Iritis, chronic   . Hypertension   . Hypercholesteremia   . Seasonal allergies   . GERD (gastroesophageal reflux disease)    Current Outpatient Prescriptions on File Prior to Visit  Medication Sig Dispense Refill  . fluticasone (FLONASE) 50 MCG/ACT nasal spray USE 2 SPRAYS IN EACH NOSTRIL EVERY DAY  16 g  11  . Multiple Vitamins-Minerals (MULTIVITAMINS THER. W/MINERALS) TABS Take 1 tablet by mouth daily.        . pantoprazole (PROTONIX) 40 MG tablet TAKE 1 TABLET BY MOUTH EVERY DAY  30 tablet  11   No current facility-administered medications on file prior to visit.   No Known Allergies History   Social History  . Marital Status: Married    Spouse Name: N/A    Number of Children: N/A  . Years of Education: N/A   Occupational History  . Not on file.   Social History Main Topics  . Smoking status: Current Some Day Smoker -- 5 years    Types: Cigarettes  . Smokeless tobacco: Never Used  . Alcohol Use: 0.0 oz/week  . Drug Use: No  . Sexual Activity: Not on file   Other Topics Concern  . Not on file   Social History Narrative  . No narrative on file     Review of Systems  All other systems reviewed and are negative.       Objective:   Physical Exam  Vitals reviewed. Cardiovascular: Normal rate, regular rhythm and normal heart sounds.   Pulmonary/Chest: Effort normal and breath  sounds normal.  Musculoskeletal:       Left knee: He exhibits abnormal meniscus. He exhibits normal range of motion, no swelling, no effusion, no LCL laxity, normal patellar mobility, no bony tenderness and no MCL laxity. Tenderness found. Medial joint line tenderness noted. No lateral joint line, no MCL and no LCL tenderness noted.          Assessment & Plan:  1. Left  knee pain I suspect a mild sprain I cannot rule out a medial meniscal tear.  Begin 1200 mg by mouth daily for the next 2 weeks. If symptoms do not improve I would proceed with an x-ray of the left knee and possibly a cortisone injection. - oxaprozin (DAYPRO) 600 MG tablet; Take 2 tablets (1,200 mg total) by mouth daily.  Dispense: 60 tablet; Refill: 0

## 2013-01-27 NOTE — Telephone Encounter (Signed)
Rx Refilled  

## 2013-02-09 ENCOUNTER — Telehealth: Payer: Self-pay | Admitting: Family Medicine

## 2013-02-09 NOTE — Telephone Encounter (Signed)
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 He has been sick and he states that if helps him for a little bit with the hot steam but after a while it goes back to being congested  Call back number is [phone] (just leave a message if he doesn't answer)

## 2013-02-10 NOTE — Telephone Encounter (Signed)
Patient aware.

## 2013-02-10 NOTE — Telephone Encounter (Signed)
yes

## 2013-02-13 ENCOUNTER — Ambulatory Visit (INDEPENDENT_AMBULATORY_CARE_PROVIDER_SITE_OTHER): Payer: Medicaid Other | Admitting: Family Medicine

## 2013-02-13 ENCOUNTER — Encounter: Payer: Self-pay | Admitting: Family Medicine

## 2013-02-13 VITALS — BP 150/100 | HR 64 | Temp 98.2°F | Resp 18 | Ht 75.0 in | Wt 248.0 lb

## 2013-02-13 DIAGNOSIS — I10 Essential (primary) hypertension: Secondary | ICD-10-CM

## 2013-02-13 DIAGNOSIS — L03011 Cellulitis of right finger: Secondary | ICD-10-CM

## 2013-02-13 DIAGNOSIS — IMO0002 Reserved for concepts with insufficient information to code with codable children: Secondary | ICD-10-CM

## 2013-02-13 MED ORDER — SULFAMETHOXAZOLE-TMP DS 800-160 MG PO TABS: 1.0000 | ORAL_TABLET | Freq: Two times a day (BID) | ORAL | Status: DC

## 2013-02-13 NOTE — Progress Notes (Signed)
   Subjective:    Patient ID: Philip Cox, male    DOB: 01-19-1976, 38 y.o.   MRN: [id number]  HPI Patient has had a cold for one-week. It consists of chest congestion cough that is productive of yellow sputum, sore and scratchy throat.  He is currently taking Mucinex DM. He also has a swollen tender erythematous medial right great toenail margin consistent with an evolving paronychia. There is no evidence of an ingrown toenail. His blood pressure is also extremely elevated today at 150/100. He has a history of hypertension. Past Medical History  Diagnosis Date  . Iritis, chronic   . Hypertension   . Hypercholesteremia   . Seasonal allergies   . GERD (gastroesophageal reflux disease)    Current Outpatient Prescriptions on File Prior to Visit  Medication Sig Dispense Refill  . cetirizine (ZYRTEC) 10 MG tablet Take 1 tablet (10 mg total) by mouth daily.  30 tablet  11  . fluticasone (FLONASE) 50 MCG/ACT nasal spray USE 2 SPRAYS IN EACH NOSTRIL EVERY DAY  16 g  11  . Multiple Vitamins-Minerals (MULTIVITAMINS THER. W/MINERALS) TABS Take 1 tablet by mouth daily.        Marland Kitchen. oxaprozin (DAYPRO) 600 MG tablet Take 2 tablets (1,200 mg total) by mouth daily.  60 tablet  0  . pantoprazole (PROTONIX) 40 MG tablet TAKE 1 TABLET BY MOUTH EVERY DAY  30 tablet  11   No current facility-administered medications on file prior to visit.   No Known Allergies History   Social History  . Marital Status: Married    Spouse Name: N/A    Number of Children: N/A  . Years of Education: N/A   Occupational History  . Not on file.   Social History Main Topics  . Smoking status: Current Some Day Smoker -- 5 years    Types: Cigarettes  . Smokeless tobacco: Never Used  . Alcohol Use: 0.0 oz/week  . Drug Use: No  . Sexual Activity: Not on file   Other Topics Concern  . Not on file   Social History Narrative  . No narrative on file      Review of Systems  All other systems reviewed and are  negative.       Objective:   Physical Exam  Vitals reviewed. HENT:  Right Ear: External ear normal.  Left Ear: External ear normal.  Nose: Nose normal.  Mouth/Throat: Oropharynx is clear and moist. No oropharyngeal exudate.  Cardiovascular: Normal rate, regular rhythm and normal heart sounds.   No murmur heard. Pulmonary/Chest: Effort normal and breath sounds normal. No respiratory distress. He has no wheezes. He has no rales. He exhibits no tenderness.  Skin: Skin is warm. There is erythema.    See description in history of present illness      Assessment & Plan:  1. Paronychia, right I also recommended warm saltwater soaks 3 times a day.  Regards to the cold, I recommended tincture of time and continue Mucinex. - sulfamethoxazole-trimethoprim (BACTRIM DS) 800-160 MG per tablet; Take 1 tablet by mouth 2 (two) times daily.  Dispense: 14 tablet; Refill: 0  2. HTN (hypertension) I recommended medication. The patient declines at the present time. He would like to try to get his blood pressure down naturally and recheck his blood pressure in one month.

## 2013-04-28 ENCOUNTER — Ambulatory Visit: Payer: Medicaid Other | Admitting: Family Medicine

## 2013-06-12 ENCOUNTER — Other Ambulatory Visit: Payer: Self-pay | Admitting: Family Medicine

## 2013-06-12 NOTE — Telephone Encounter (Signed)
Refill appropriate and filled per protocol. 

## 2013-10-08 ENCOUNTER — Other Ambulatory Visit: Payer: Self-pay | Admitting: Family Medicine

## 2013-10-08 MED ORDER — FLUTICASONE PROPIONATE 50 MCG/ACT NA SUSP: NASAL | Status: DC

## 2013-10-08 NOTE — Telephone Encounter (Signed)
Flonase sent to pharm for refill. Received a fax for Lisinopril-HCTZ and in Dr. Rhona Raider LOV he was not suppose to be taking this as long as his BP was good. At last 2 ovs for acute conditions his BP has been good. Will deny this and if he needs it he needs an OV

## 2013-10-13 ENCOUNTER — Other Ambulatory Visit: Payer: Self-pay | Admitting: Family Medicine

## 2013-11-10 ENCOUNTER — Other Ambulatory Visit: Payer: Self-pay | Admitting: Family Medicine

## 2014-01-27 ENCOUNTER — Encounter (HOSPITAL_COMMUNITY): Payer: Self-pay | Admitting: *Deleted

## 2014-01-27 ENCOUNTER — Emergency Department (HOSPITAL_COMMUNITY)
Admission: EM | Admit: 2014-01-27 | Discharge: 2014-01-27 | Disposition: A | Discharge status: Home / Self Care | Payer: Medicaid Other | Attending: Emergency Medicine | Admitting: Emergency Medicine

## 2014-01-27 DIAGNOSIS — K219 Gastro-esophageal reflux disease without esophagitis: Secondary | ICD-10-CM | POA: Insufficient documentation

## 2014-01-27 DIAGNOSIS — Z79899 Other long term (current) drug therapy: Secondary | ICD-10-CM | POA: Insufficient documentation

## 2014-01-27 DIAGNOSIS — Z7951 Long term (current) use of inhaled steroids: Secondary | ICD-10-CM | POA: Insufficient documentation

## 2014-01-27 DIAGNOSIS — Z792 Long term (current) use of antibiotics: Secondary | ICD-10-CM | POA: Insufficient documentation

## 2014-01-27 DIAGNOSIS — K0889 Other specified disorders of teeth and supporting structures: Secondary | ICD-10-CM

## 2014-01-27 DIAGNOSIS — Z8669 Personal history of other diseases of the nervous system and sense organs: Secondary | ICD-10-CM | POA: Insufficient documentation

## 2014-01-27 DIAGNOSIS — K088 Other specified disorders of teeth and supporting structures: Secondary | ICD-10-CM | POA: Insufficient documentation

## 2014-01-27 DIAGNOSIS — I10 Essential (primary) hypertension: Secondary | ICD-10-CM | POA: Insufficient documentation

## 2014-01-27 DIAGNOSIS — R51 Headache: Secondary | ICD-10-CM | POA: Insufficient documentation

## 2014-01-27 DIAGNOSIS — Z8639 Personal history of other endocrine, nutritional and metabolic disease: Secondary | ICD-10-CM | POA: Insufficient documentation

## 2014-01-27 DIAGNOSIS — Z720 Tobacco use: Secondary | ICD-10-CM | POA: Insufficient documentation

## 2014-01-27 MED ORDER — HYDROCODONE-ACETAMINOPHEN 5-325 MG PO TABS: 1.0000 | ORAL_TABLET | ORAL | Status: DC | PRN

## 2014-01-27 MED ORDER — PENICILLIN V POTASSIUM 500 MG PO TABS: 500.0000 mg | ORAL_TABLET | Freq: Three times a day (TID) | ORAL | Status: DC

## 2014-01-27 NOTE — Discharge Instructions (Signed)
Dental Pain °A tooth ache may be caused by cavities (tooth decay). Cavities expose the nerve of the tooth to air and hot or cold temperatures. It may come from an infection or abscess (also called a boil or furuncle) around your tooth. It is also often caused by dental caries (tooth decay). This causes the pain you are having. °DIAGNOSIS  °Your caregiver can diagnose this problem by exam. °TREATMENT  °· If caused by an infection, it may be treated with medications which kill germs (antibiotics) and pain medications as prescribed by your caregiver. Take medications as directed. °· Only take over-the-counter or prescription medicines for pain, discomfort, or fever as directed by your caregiver. °· Whether the tooth ache today is caused by infection or dental disease, you should see your dentist as soon as possible for further care. °SEEK MEDICAL CARE IF: °The exam and treatment you received today has been provided on an emergency basis only. This is not a substitute for complete medical or dental care. If your problem worsens or new problems (symptoms) appear, and you are unable to meet with your dentist, call or return to this location. °SEEK IMMEDIATE MEDICAL CARE IF:  °· You have a fever. °· You develop redness and swelling of your face, jaw, or neck. °· You are unable to open your mouth. °· You have severe pain uncontrolled by pain medicine. °MAKE SURE YOU:  °· Understand these instructions. °· Will watch your condition. °· Will get help right away if you are not doing well or get worse. °Document Released: 01/22/2005 Document Revised: 04/16/2011 Document Reviewed: 09/10/2007 °ExitCare® Patient Information ©2015 [employer]. This information is not intended to replace advice given to you by your health care provider. Make sure you discuss any questions you have with your health care provider. ° °Emergency Department Resource Guide °1) Find a Doctor and Pay Out of Pocket °Although you won't have to find out who  is covered by your insurance plan, it is a good idea to ask around and get recommendations. You will then need to call the office and see if the doctor you have chosen will accept you as a new patient and what types of options they offer for patients who are self-pay. Some doctors offer discounts or will set up payment plans for their patients who do not have insurance, but you will need to ask so you aren't surprised when you get to your appointment. ° °2) Contact Your Local Health Department °Not all health departments have doctors that can see patients for sick visits, but many do, so it is worth a call to see if yours does. If you don't know where your local health department is, you can check in your phone book. The CDC also has a tool to help you locate your state's health department, and many state websites also have listings of all of their local health departments. ° °3) Find a Walk-in Clinic °If your illness is not likely to be very severe or complicated, you may want to try a walk in clinic. These are popping up all over the country in pharmacies, drugstores, and shopping centers. They're usually staffed by nurse practitioners or physician assistants that have been trained to treat common illnesses and complaints. They're usually fairly quick and inexpensive. However, if you have serious medical issues or chronic medical problems, these are probably not your best option. ° °No Primary Care Doctor: °- Call [facility] at  [phone] - they can help you locate a primary   care doctor that  accepts your insurance, provides certain services, etc. °- Physician Referral Service- 1-800-533-3463 ° °Chronic Pain Problems: °Organization         Address  Phone   Notes  °[facility]  ([phone]) [phone] Patients need to be referred by their primary care doctor.  ° °Medication Assistance: °Organization         Address  Phone   Notes  °[place] [facility] [address]  [address] °[place], [place] [address] (336) 641-8030 --Must be a resident of [place] °-- Must have NO insurance coverage whatsoever (no Medicaid/ Medicare, etc.) °-- The pt. MUST have a primary care doctor that directs their care regularly and follows them in the community °  °[facility]  (866) 331-1348   °[employer]  (888) 892-1162   ° °Agencies that provide inexpensive medical care: °Organization         Address  Phone   Notes  °[facility]  ([phone]) [phone]   °[facility]    (336) 832-7272   °[facility] [address] °[place], [place] [address] (336) 832-4777   °[facility] [address], °[place] (336) 271-4999   °[facility]    (336) 373-0678   °[facility]    (336) 272-1050   °[facility] ° [address], [place] Phone:  (336) 832-4444, Fax:  (336) [phone] Hours of Operation:  9 am - 6 pm, M-F.  Also accepts Medicaid/Medicare and self-pay.  °[facility] ° [address], [place] Phone: (336) [phone], Fax: (336) [phone]. Hours of Operation:  8:30 am - 5:30 pm, M-F.  Also accepts Medicaid and self-pay.  °[facility] [place] [address], [place] Phone: (336) [phone]   °[facility] [address], [place], [place] (336)723-1848, Ext. 123 Mondays & Thursdays: 7-9 AM.  First 15 patients are seen on a first come, first serve basis. °  ° °Medicaid-accepting [place] Providers: ° °Organization         Address  Phone   Notes  °[facility] [address], [place] ([phone]) [phone] Also accepts self-pay patients.  °[facility] [address], [place] ° (336) [phone]   °[facility] [address], [place] (336) [phone]   °[facility] [address] [place] [address], [place] (336) [phone]   °[facility] [address], [place]  ° (336) [phone] Only accepts [place] Access Medicaid patients after they have their name applied to their card.  ° °Self-Pay (no insurance) in [place]: ° °Organization         Address  Phone   Notes  °Sickle Cell Patients, [facility] [address], [place] (336) [phone]   °[facility] [address], [place] (336) [phone]   °[facility] [place] ° [address], [place] (336) [phone]   °[facility]/Dr. Osei-Bonsu ° [address] [place] [address], [place] or [address], [place] ([phone]) [phone] Phone number for both [place] and [place] locations is the same.  °[facility] [address], [place] (336) [phone]   °[facility] [address] [place] [address],  or [address] (336) 852-7530 °(336) [phone]   °  [facility] [address], [place] (336) 350-1642, phone; (336) [phone], fax Sees patients 1st and 3rd Saturday of every month.  Must not qualify for public or private insurance (i.e. Medicaid, Medicare, [place] Health Choice, Veterans' Benefits) • Household income should be no more than 200% of the poverty level •The clinic cannot treat you if you are pregnant or think you are pregnant • Sexually transmitted diseases are not treated at the clinic.  ° ° °Dental Care: °Organization         Address  Phone  Notes  °[facility] [address], [place] (336) [phone] Accepts children up to age 21 who are enrolled in Medicaid or [place] Health Choice; pregnant women with a Medicaid card; and children who have applied for Medicaid or [place] Health Choice, but were declined, whose parents can pay a reduced fee at time of service.  °[facility] [place]  [address], [place] (336) 641-7733 Accepts children up to age 21 who are  enrolled in Medicaid or [place] Health Choice; pregnant women with a Medicaid card; and children who have applied for Medicaid or [place] Health Choice, but were declined, whose parents can pay a reduced fee at time of service.  °[facility] ° [address], [place] (336) [phone] Patients are seen by appointment only. Walk-ins are not accepted. [facility] will see patients 18 years of age and older. °Monday - Tuesday (8am-5pm) °Most Wednesdays (8:30-5pm) °$30 per visit, cash only  °[facility] ° [address], [place] ([phone]) [phone] Patients are seen by appointment only. Walk-ins are not accepted. [facility] will see patients 18 years of age and older. °One Wednesday Evening (Monthly: Volunteer Based).  $30 per visit, cash only  °[facility]  ([phone]) [phone] for adults; Children under age 4, call Graduate Pediatric Dentistry at ([phone]) [phone]. Children aged 4-14, please call ([phone]) [phone] to request a pediatric application. ° Dental services are provided in all areas of dental care including fillings, crowns and bridges, complete and partial dentures, implants, gum treatment, root canals, and extractions. Preventive care is also provided. Treatment is provided to both adults and children. °Patients are selected via a lottery and there is often a waiting list. °  °[facility] [address], ° ° (336) 763-8833 [email or web address] °  °[facility] [address], [place], [place] (336)723-1848, Ext. 123 Second and Fourth Thursday of each month, opens at 6:30 AM; Clinic ends at 9 AM.  Patients are seen on a first-come first-served basis, and a limited number are seen during each clinic.  ° °[facility] ° [address], [place], [place] (336) [phone]   Eligibility Requirements °You must have lived in [place], [place], or [place] counties for at least the last three months. °  You  cannot be eligible for state or federal sponsored healthcare insurance, including Veterans Administration, Medicaid, or Medicare. °  You generally cannot be eligible for healthcare insurance through your employer.  °  How to apply: °Eligibility screenings are held every Tuesday and Wednesday afternoon from 1:00 pm until 4:00 pm. You do not need an appointment for the interview!  °[facility] [address], [place],  [phone]   °[facility]  [phone]   °[facility]  [phone]   °[facility]  [phone]   °  °

## 2014-01-27 NOTE — ED Notes (Signed)
PA at BS.  

## 2014-01-27 NOTE — ED Notes (Signed)
Pt in c/o toothache for the last week, increased pain in the last few days

## 2014-01-27 NOTE — ED Provider Notes (Signed)
CSN: [id number]     Arrival date & time 01/27/14  1547 History  This chart was scribed for non-physician practitioner, Elpidio AnisShari Chaseton Yepiz, PA-C, working with Flint MelterElliott L Wentz, MD, by Bronson CurbJacqueline Melvin, ED Scribe. This patient was seen in room TR09C/TR09C and the patient's care was started at 4:27 PM.   Chief Complaint  Patient presents with  . Dental Pain    The history is provided by the patient. No language interpreter was used.   HPI Comments: Philip Cox is a 38 y.o. male who presents to the Emergency Department complaining of constant, upper left dental pain that started a week, that has worsened over the last 3 days. Patient states the pain radiates up to his left eye and down to his lower left jaw. There is associated HA. He has taken ibuprofen and tried salt water rinses without significant relief. Patient denies gingival swelling or fever.    Past Medical History  Diagnosis Date  . Iritis, chronic   . Hypertension   . Hypercholesteremia   . Seasonal allergies   . GERD (gastroesophageal reflux disease)    Past Surgical History  Procedure Laterality Date  . Appendectomy    . Knee surgery      right   History reviewed. No pertinent family history. History  Substance Use Topics  . Smoking status: Current Some Day Smoker -- 5 years    Types: Cigarettes  . Smokeless tobacco: Never Used  . Alcohol Use: 0.0 oz/week    Review of Systems  Constitutional: Negative for fever.  HENT: Positive for dental problem.   Neurological: Positive for headaches.      Allergies  Review of patient's allergies indicates no known allergies.  Home Medications   Prior to Admission medications   Medication Sig Start Date End Date Taking? Authorizing Provider  cetirizine (ZYRTEC) 10 MG tablet Take 1 tablet (10 mg total) by mouth daily. 01/27/13   Salley ScarletKawanta F [place], MD  fluticasone [facility](FLONASE) 50 MCG/ACT nasal spray USE 2 SPRAYS [facility] EVERY DAY 10/08/13   Donita BrooksWarren T Pickard, MD   lisinopril-hydrochlorothiazide (PRINZIDE,ZESTORETIC) 20-12.5 MG per tablet TAKE 1 TABLET BY MOUTH EVERY DAY 11/10/13   Donita BrooksWarren T Pickard, MD  Multiple Vitamins-Minerals (MULTIVITAMINS THER. W/MINERALS) TABS Take 1 tablet by mouth daily.      Historical Provider, MD  oxaprozin (DAYPRO) 600 MG tablet Take 2 tablets (1,200 mg total) by mouth daily. 01/27/13   Donita BrooksWarren T Pickard, MD  pantoprazole (PROTONIX) 40 MG tablet TAKE 1 TABLET BY MOUTH EVERY DAY 06/12/13   Salley ScarletKawanta F [place], MD  sulfamethoxazole-trimethoprim (BACTRIM DS) 800-160 MG per tablet Take 1 tablet by mouth 2 (two) times daily. 02/13/13   Donita BrooksWarren T Pickard, MD   Triage Vitals: BP 132/78 mmHg  Pulse 77  Temp(Src) 98 F (36.7 C) (Oral)  Resp 20  Ht 6\' 3"  (1.905 m)  Wt 260 lb (117.935 kg)  BMI 32.50 kg/m2  SpO2 100%  Physical Exam  Constitutional: He is oriented to person, place, and time. He appears well-developed and well-nourished. No distress.  HENT:  Head: Normocephalic and atraumatic.  Generally good dentition. No visualized abscess. Mildly tender surrounding number 15. No facial swelling. No adenopathy.  Eyes: Conjunctivae and EOM are normal.  Neck: Neck supple. No tracheal deviation present.  Cardiovascular: Normal rate.   Pulmonary/Chest: Effort normal. No respiratory distress.  Musculoskeletal: Normal range of motion.  Neurological: He is alert and oriented to person, place, and time.  Skin: Skin is warm and dry.  Psychiatric: He has a normal mood and affect. His behavior is normal.  Nursing note and vitals reviewed.   ED Course  Procedures (including critical care time)  DIAGNOSTIC STUDIES: Oxygen Saturation is 100% on room air, normal by my interpretation.    COORDINATION OF CARE: At 1630 Discussed treatment plan with patient which includes ABX and pain medication. Patient agrees.   Labs Review Labs Reviewed - No data to display  Imaging Review No results found.   EKG Interpretation None      MDM    Final diagnoses:  None    1. Dental pain  Will cover with abx, pain management and dental referral.  I personally performed the services described in this documentation, which was scribed in my presence. The recorded information has been reviewed and is accurate.     Arnoldo HookerShari A Deriyah Kunath, PA-C 01/31/14 2054  Flint MelterElliott L Wentz, MD 02/01/14 [phone]

## 2014-01-31 ENCOUNTER — Encounter (HOSPITAL_COMMUNITY): Payer: Self-pay

## 2014-01-31 ENCOUNTER — Emergency Department (HOSPITAL_COMMUNITY)
Admission: EM | Admit: 2014-01-31 | Discharge: 2014-01-31 | Disposition: A | Discharge status: Home / Self Care | Payer: Medicaid Other | Attending: Emergency Medicine | Admitting: Emergency Medicine

## 2014-01-31 DIAGNOSIS — R200 Anesthesia of skin: Secondary | ICD-10-CM | POA: Insufficient documentation

## 2014-01-31 DIAGNOSIS — Z79899 Other long term (current) drug therapy: Secondary | ICD-10-CM | POA: Insufficient documentation

## 2014-01-31 DIAGNOSIS — Z791 Long term (current) use of non-steroidal anti-inflammatories (NSAID): Secondary | ICD-10-CM | POA: Insufficient documentation

## 2014-01-31 DIAGNOSIS — Z7951 Long term (current) use of inhaled steroids: Secondary | ICD-10-CM | POA: Insufficient documentation

## 2014-01-31 DIAGNOSIS — Z720 Tobacco use: Secondary | ICD-10-CM | POA: Insufficient documentation

## 2014-01-31 DIAGNOSIS — K088 Other specified disorders of teeth and supporting structures: Secondary | ICD-10-CM | POA: Insufficient documentation

## 2014-01-31 DIAGNOSIS — K0889 Other specified disorders of teeth and supporting structures: Secondary | ICD-10-CM

## 2014-01-31 DIAGNOSIS — Z8669 Personal history of other diseases of the nervous system and sense organs: Secondary | ICD-10-CM | POA: Insufficient documentation

## 2014-01-31 DIAGNOSIS — I10 Essential (primary) hypertension: Secondary | ICD-10-CM | POA: Insufficient documentation

## 2014-01-31 DIAGNOSIS — Z792 Long term (current) use of antibiotics: Secondary | ICD-10-CM | POA: Insufficient documentation

## 2014-01-31 DIAGNOSIS — K219 Gastro-esophageal reflux disease without esophagitis: Secondary | ICD-10-CM | POA: Insufficient documentation

## 2014-01-31 DIAGNOSIS — Z8639 Personal history of other endocrine, nutritional and metabolic disease: Secondary | ICD-10-CM | POA: Insufficient documentation

## 2014-01-31 MED ORDER — HYDROCODONE-ACETAMINOPHEN 5-325 MG PO TABS: 1.0000 | ORAL_TABLET | ORAL | Status: DC | PRN

## 2014-01-31 NOTE — ED Provider Notes (Signed)
CSN: [id number]     Arrival date & time 01/31/14  1022 History   This chart was scribed for non-physician practitioner, Fayrene HelperBowie Naasir Carreira, PA-C working with Warnell Foresterrey Wofford, MD, by Abel PrestoKara Demonbreun, ED Scribe. This patient was seen in room TR06C/TR06C and the patient's care was started at 11:17 AM.     Chief Complaint  Patient presents with  . Dental Pain    HPI  HPI Comments: Philip Cox is a 38 y.o. male who presents to the Emergency Department complaining of constant sharp pulsating dental pain in left upper tooth with onset 3 days ago. Pt was seen 5 days ago for the same pain. Pt notes the pain now radiates to other areas in his mouth and jaw. Pt states that the pain was almost resolved last night but returned this morning. Pt likens the pain to s/p dental filling with numbness in surrounding gum area. Pt notes he notices the pain when he is sitting still and with mouth open, exposed to air. Pt notes nothing makes it worse, but has used salt water rinse for relief.  Pt as been taking his penicillin. Pt notes he does not have a job or insurance and is unable to see anyone for the problem until tomorrow. Pt denies chest pain, SOB, and rash.   Past Medical History  Diagnosis Date  . Iritis, chronic   . Hypertension   . Hypercholesteremia   . Seasonal allergies   . GERD (gastroesophageal reflux disease)    Past Surgical History  Procedure Laterality Date  . Appendectomy    . Knee surgery      right   History reviewed. No pertinent family history. History  Substance Use Topics  . Smoking status: Current Some Day Smoker -- 5 years    Types: Cigarettes  . Smokeless tobacco: Never Used  . Alcohol Use: 0.0 oz/week    Review of Systems  HENT: Positive for dental problem.   Respiratory: Negative for shortness of breath.   Cardiovascular: Negative for chest pain.  Skin: Negative for rash.  Neurological: Positive for numbness (in surrounding gum area).      Allergies  Review of  patient's allergies indicates no known allergies.  Home Medications   Prior to Admission medications   Medication Sig Start Date End Date Taking? Authorizing Provider  cetirizine (ZYRTEC) 10 MG tablet Take 1 tablet (10 mg total) by mouth daily. 01/27/13   Salley ScarletKawanta F [place], MD  fluticasone [facility](FLONASE) 50 MCG/ACT nasal spray USE 2 SPRAYS IN EACH NOSTRIL EVERY DAY 10/08/13   Donita BrooksWarren T Pickard, MD  HYDROcodone-acetaminophen (NORCO/VICODIN) 5-325 MG per tablet Take 1-2 tablets by mouth every 4 (four) hours as needed. 01/27/14   Shari A Upstill, PA-C  lisinopril-hydrochlorothiazide (PRINZIDE,ZESTORETIC) 20-12.5 MG per tablet TAKE 1 TABLET BY MOUTH EVERY DAY 11/10/13   Donita BrooksWarren T Pickard, MD  Multiple Vitamins-Minerals (MULTIVITAMINS THER. W/MINERALS) TABS Take 1 tablet by mouth daily.      Historical Provider, MD  oxaprozin (DAYPRO) 600 MG tablet Take 2 tablets (1,200 mg total) by mouth daily. 01/27/13   Donita BrooksWarren T Pickard, MD  pantoprazole (PROTONIX) 40 MG tablet TAKE 1 TABLET BY MOUTH EVERY DAY 06/12/13   Salley ScarletKawanta F [place], MD  penicillin v potassium (VEETID) 500 MG tablet Take 1 tablet (500 mg total) by mouth 3 (three) times daily. 01/27/14   Shari A Upstill, PA-C  sulfamethoxazole-trimethoprim (BACTRIM DS) 800-160 MG per tablet Take 1 tablet by mouth 2 (two) times daily. 02/13/13   Donita BrooksWarren T Pickard,  MD   BP 192/133 mmHg  Pulse 97  Temp(Src) 97.8 F (36.6 C) (Oral)  Resp 20  SpO2 100% Physical Exam  Constitutional: He is oriented to person, place, and time. He appears well-developed and well-nourished.  HENT:  Head: Normocephalic.  Right Ear: Tympanic membrane, external ear and ear canal normal.  Left Ear: Tympanic membrane, external ear and ear canal normal.  Mouth/Throat: No dental abscesses.  No significant dental decay Subjective tenderness to tooth #14 and #15 No obvious abscess No evidence of mastoiditis  Eyes: Conjunctivae are normal.  Neck: Normal range of motion. Neck supple.   Pulmonary/Chest: Effort normal.  Musculoskeletal: Normal range of motion.  Neurological: He is alert and oriented to person, place, and time.  Skin: Skin is warm and dry.  Psychiatric: He has a normal mood and affect. His behavior is normal.  Nursing note and vitals reviewed.   ED Course  Procedures (including critical care time) DIAGNOSTIC STUDIES: Oxygen Saturation is 100% on room air, normal by my interpretation.    COORDINATION OF CARE: 11:23 AM Discussed treatment plan with patient at beside, the patient agrees with the plan and has no further questions at this time. Suspect dental pain. Doubt acute emergent condition. Dental referral given.    An isolated elevated BP of 192/133 was recorded but pt denies cp or sob.  BP normalized on recheck.   Labs Review Labs Reviewed - No data to display  Imaging Review No results found.   EKG Interpretation None      MDM   Final diagnoses:  Pain, dental    I personally performed the services described in this documentation, which was scribed in my presence. The recorded information has been reviewed and is accurate.      Fayrene HelperBowie Kathalene Sporer, PA-C 01/31/14 1446  Candyce ChurnJohn David Wofford III, MD 02/02/14 [phone]

## 2014-01-31 NOTE — Discharge Instructions (Signed)
Dental Care and Dentist Visits  Dental care supports good overall health. Regular dental visits can also help you avoid dental pain, bleeding, infection, and other more serious health problems in the future. It is important to keep the mouth healthy because diseases in the teeth, gums, and other oral tissues can spread to other areas of the body. Some problems, such as diabetes, heart disease, and pre-term labor have been associated with poor oral health.   See your dentist every 6 months. If you experience emergency problems such as a toothache or broken tooth, go to the dentist right away. If you see your dentist regularly, you may catch problems early. It is easier to be treated for problems in the early stages.   WHAT TO EXPECT AT A DENTIST VISIT   Your dentist will look for many common oral health problems and recommend proper treatment. At your regular dental visit, you can expect:  · Gentle cleaning of the teeth and gums. This includes scraping and polishing. This helps to remove the sticky substance around the teeth and gums (plaque). Plaque forms in the mouth shortly after eating. Over time, plaque hardens on the teeth as tartar. If tartar is not removed regularly, it can cause problems. Cleaning also helps remove stains.  · Periodic X-rays. These pictures of the teeth and supporting bone will help your dentist assess the health of your teeth.  · Periodic fluoride treatments. Fluoride is a natural mineral shown to help strengthen teeth. Fluoride treatment involves applying a fluoride gel or varnish to the teeth. It is most commonly done in children.  · Examination of the mouth, tongue, jaws, teeth, and gums to look for any oral health problems, such as:  ¨ Cavities (dental caries). This is decay on the tooth caused by plaque, sugar, and acid in the mouth. It is best to catch a cavity when it is small.  ¨ Inflammation of the gums caused by plaque buildup (gingivitis).  ¨ Problems with the mouth or malformed  or misaligned teeth.  ¨ Oral cancer or other diseases of the soft tissues or jaws.   KEEP YOUR TEETH AND GUMS HEALTHY  For healthy teeth and gums, follow these general guidelines as well as your dentist's specific advice:  · Have your teeth professionally cleaned at the dentist every 6 months.  · Brush twice daily with a fluoride toothpaste.  · Floss your teeth daily.   · Ask your dentist if you need fluoride supplements, treatments, or fluoride toothpaste.  · Eat a healthy diet. Reduce foods and drinks with added sugar.  · Avoid smoking.  TREATMENT FOR ORAL HEALTH PROBLEMS  If you have oral health problems, treatment varies depending on the conditions present in your teeth and gums.  · Your caregiver will most likely recommend good oral hygiene at each visit.  · For cavities, gingivitis, or other oral health disease, your caregiver will perform a procedure to treat the problem. This is typically done at a separate appointment. Sometimes your caregiver will refer you to another dental specialist for specific tooth problems or for surgery.  SEEK IMMEDIATE DENTAL CARE IF:  · You have pain, bleeding, or soreness in the gum, tooth, jaw, or mouth area.  · A permanent tooth becomes loose or separated from the gum socket.  · You experience a blow or injury to the mouth or jaw area.  Document Released: 10/04/2010 Document Revised: 04/16/2011 Document Reviewed: 10/04/2010  ExitCare® Patient Information ©2015 [employer]. This information is not intended to replace advice   given to you by your health care provider. Make sure you discuss any questions you have with your health care provider.

## 2014-01-31 NOTE — ED Notes (Signed)
Pt seen Wednesday for same pain. Left upper gum still hurting.

## 2014-04-21 ENCOUNTER — Other Ambulatory Visit: Payer: Self-pay | Admitting: Family Medicine

## 2014-04-22 NOTE — Telephone Encounter (Signed)
Medication filled x1 with no refills.   Requires office visit before any further refills can be given.   Letter sent.  

## 2016-12-31 ENCOUNTER — Emergency Department (HOSPITAL_COMMUNITY)
Admission: EM | Admit: 2016-12-31 | Discharge: 2016-12-31 | Disposition: A | Discharge status: Home / Self Care | Payer: Self-pay | Attending: Emergency Medicine | Admitting: Emergency Medicine

## 2016-12-31 ENCOUNTER — Encounter (HOSPITAL_COMMUNITY): Payer: Self-pay

## 2016-12-31 ENCOUNTER — Emergency Department (HOSPITAL_COMMUNITY): Payer: Self-pay

## 2016-12-31 ENCOUNTER — Other Ambulatory Visit: Payer: Self-pay

## 2016-12-31 DIAGNOSIS — Y999 Unspecified external cause status: Secondary | ICD-10-CM | POA: Insufficient documentation

## 2016-12-31 DIAGNOSIS — Y9389 Activity, other specified: Secondary | ICD-10-CM | POA: Insufficient documentation

## 2016-12-31 DIAGNOSIS — F17210 Nicotine dependence, cigarettes, uncomplicated: Secondary | ICD-10-CM | POA: Insufficient documentation

## 2016-12-31 DIAGNOSIS — S62619B Displaced fracture of proximal phalanx of unspecified finger, initial encounter for open fracture: Secondary | ICD-10-CM | POA: Insufficient documentation

## 2016-12-31 DIAGNOSIS — W268XXA Contact with other sharp object(s), not elsewhere classified, initial encounter: Secondary | ICD-10-CM | POA: Insufficient documentation

## 2016-12-31 DIAGNOSIS — Z79899 Other long term (current) drug therapy: Secondary | ICD-10-CM | POA: Insufficient documentation

## 2016-12-31 DIAGNOSIS — Y929 Unspecified place or not applicable: Secondary | ICD-10-CM | POA: Insufficient documentation

## 2016-12-31 DIAGNOSIS — Z23 Encounter for immunization: Secondary | ICD-10-CM | POA: Insufficient documentation

## 2016-12-31 DIAGNOSIS — I10 Essential (primary) hypertension: Secondary | ICD-10-CM | POA: Insufficient documentation

## 2016-12-31 MED ORDER — OXYCODONE-ACETAMINOPHEN 5-325 MG PO TABS
2.0000 | ORAL_TABLET | Freq: Once | ORAL | Status: AC
  Administered 2016-12-31: 2 via ORAL
  Filled 2016-12-31: qty 2

## 2016-12-31 MED ORDER — LIDOCAINE HCL (PF) 1 % IJ SOLN
INTRAMUSCULAR | Status: AC
  Filled 2016-12-31: qty 30

## 2016-12-31 MED ORDER — TETANUS-DIPHTH-ACELL PERTUSSIS 5-2.5-18.5 LF-MCG/0.5 IM SUSP
0.5000 mL | Freq: Once | INTRAMUSCULAR | Status: AC
  Administered 2016-12-31: 0.5 mL via INTRAMUSCULAR
  Filled 2016-12-31: qty 0.5

## 2016-12-31 MED ORDER — CEPHALEXIN 500 MG PO CAPS: 500.0000 mg | ORAL_CAPSULE | Freq: Three times a day (TID) | ORAL | 0 refills | Status: DC

## 2016-12-31 MED ORDER — CEFAZOLIN SODIUM-DEXTROSE 1-4 GM/50ML-% IV SOLN
1.0000 g | Freq: Once | INTRAVENOUS | Status: AC
  Administered 2016-12-31: 1 g via INTRAVENOUS
  Filled 2016-12-31: qty 50

## 2016-12-31 MED ORDER — OXYCODONE-ACETAMINOPHEN 5-325 MG PO TABS: 1.0000 | ORAL_TABLET | ORAL | 0 refills | Status: DC | PRN

## 2016-12-31 NOTE — ED Provider Notes (Signed)
[facility] Provider Note   CSN: [id number] Arrival date & time: 12/31/16  1649     History   Chief Complaint Chief Complaint  Patient presents with  . Laceration    HPI Philip Cox is a 41 y.o. male.  HPI Patient states he caught his hand in a wood splitter roughly 40 minutes prior to arrival.  He was wearing 2 pairs of gloves when this happened.  The sustained laceration nearly circumferential around the base of the index finger of the left hand.  Bleeding is controlled with pressure.  States he is right-hand dominant.  Unknown last tetanus.  Complains of tingling to the tip of the finger. Past Medical History:  Diagnosis Date  . GERD (gastroesophageal reflux disease)   . Hypercholesteremia   . Hypertension   . Iritis, chronic   . Seasonal allergies     Patient Active Problem List   Diagnosis Date Noted  . Obesity, unspecified 10/27/2012  . HYPERTENSION 10/27/2009  . GERD 10/27/2009  . HEADACHE 10/27/2009  . NEPHROLITHIASIS, HX OF 10/27/2009    Past Surgical History:  Procedure Laterality Date  . APPENDECTOMY    . KNEE SURGERY     right       Home Medications    Prior to Admission medications   Medication Sig Start Date End Date Taking? Authorizing Provider  cephALEXin (KEFLEX) 500 MG capsule Take 1 capsule (500 mg total) by mouth 3 (three) times daily. 12/31/16   Loren RacerYelverton, Pihu Basil, MD  cetirizine (ZYRTEC) 10 MG tablet TAKE ONE TABLET BY MOUTH ONCE DAILY 04/22/14   Donita BrooksPickard, Warren T, MD  fluticasone [facility](FLONASE) 50 MCG/ACT nasal spray USE 2 SPRAYS IN EACH NOSTRIL EVERY DAY 10/08/13   Donita BrooksPickard, Warren T, MD  HYDROcodone-acetaminophen (NORCO/VICODIN) 5-325 MG per tablet Take 1-2 tablets by mouth every 4 (four) hours as needed. 01/31/14   Fayrene Helperran, Bowie, PA-C  lisinopril-hydrochlorothiazide (PRINZIDE,ZESTORETIC) 20-12.5 MG per tablet TAKE 1 TABLET BY MOUTH EVERY DAY 11/10/13   Donita BrooksPickard, Warren T, MD  Multiple Vitamins-Minerals  (MULTIVITAMINS THER. W/MINERALS) TABS Take 1 tablet by mouth daily.      [provider]  oxaprozin (DAYPRO) 600 MG tablet Take 2 tablets (1,200 mg total) by mouth daily. 01/27/13   Donita BrooksPickard, Warren T, MD  oxyCODONE-acetaminophen (PERCOCET) 5-325 MG tablet Take 1-2 tablets by mouth every 4 (four) hours as needed for severe pain. 12/31/16   Loren RacerYelverton, Hakan Nudelman, MD  pantoprazole (PROTONIX) 40 MG tablet TAKE 1 TABLET BY MOUTH EVERY DAY 06/12/13   Salley Scarleturham, Kawanta F, MD  penicillin v potassium (VEETID) 500 MG tablet Take 1 tablet (500 mg total) by mouth 3 (three) times daily. 01/27/14   Elpidio AnisUpstill, Shari, PA-C  sulfamethoxazole-trimethoprim (BACTRIM DS) 800-160 MG per tablet Take 1 tablet by mouth 2 (two) times daily. 02/13/13   Donita BrooksPickard, Warren T, MD    Family History No family history on file.  Social History Social History   Tobacco Use  . Smoking status: Current Some Day Smoker    Years: 5.00    Types: Cigarettes  . Smokeless tobacco: Never Used  Substance Use Topics  . Alcohol use: Yes    Alcohol/week: 0.0 oz  . Drug use: No     Allergies   Patient has no known allergies.   Review of Systems Review of Systems  Musculoskeletal: Positive for arthralgias.  Skin: Positive for wound.  Neurological: Positive for numbness.  All other systems reviewed and are negative.    Physical Exam Updated  Vital Signs BP (!) 163/100 (BP Location: Right Arm)   Pulse 81   Temp 97.7 F (36.5 C) (Oral)   Resp 16   SpO2 99%   Physical Exam  Constitutional: He is oriented to person, place, and time. He appears well-developed and well-nourished. No distress.  HENT:  Head: Normocephalic and atraumatic.  Eyes: EOM are normal. Pupils are equal, round, and reactive to light.  Neck: Normal range of motion. Neck supple.  Cardiovascular: Normal rate.  Pulmonary/Chest: Effort normal.  Abdominal: Soft.  Musculoskeletal: He exhibits tenderness and deformity. He exhibits no edema.  Patient with ulnar  angulation of index finger of the left hand.  Limited flexion and extension.  Distal cap refill is intact.  There is near circumferential macerated laceration on the middle of the proximal phalanx.  No obvious gross contamination.  Neurological: He is alert and oriented to person, place, and time.  Skin: Skin is warm and dry. No rash noted. No erythema.  Psychiatric: He has a normal mood and affect. His behavior is normal.  Nursing note and vitals reviewed.    ED Treatments / Results  Labs (all labs ordered are listed, but only abnormal results are displayed) Labs Reviewed - No data to display  EKG  EKG Interpretation None       Radiology Dg Finger Index Left  Result Date: 12/31/2016 CLINICAL DATA:  Finger laceration. EXAM: LEFT INDEX FINGER 2+V COMPARISON:  None. FINDINGS: Moderately displaced fracture is seen involving the second proximal phalanx. Associated soft tissue laceration is noted as well. Joint spaces are intact. IMPRESSION: Moderately displaced second proximal phalangeal fracture. Soft tissue laceration is noted. Electronically Signed   By: Lupita RaiderJames  Green Jr, M.D.   On: 12/31/2016 18:26    Procedures .Marland Kitchen.Laceration Repair Date/Time: 12/31/2016 8:56 PM Performed by: Loren RacerYelverton, Eligh Rybacki, MD Authorized by: Loren RacerYelverton, Sadler Teschner, MD   Consent:    Consent obtained:  Verbal Anesthesia (see MAR for exact dosages):    Anesthesia method:  Local infiltration   Local anesthetic:  Lidocaine 1% w/o epi Laceration details:    Location:  Finger   Finger location:  L index finger   Length (cm):  3 Repair type:    Repair type:  Simple Pre-procedure details:    Preparation:  Patient was prepped and draped in usual sterile fashion and imaging obtained to evaluate for foreign bodies Exploration:    Wound extent: underlying fracture     Wound extent: no foreign bodies/material noted     Contaminated: no   Treatment:    Area cleansed with:  Betadine and saline   Amount of cleaning:   Extensive   Irrigation solution:  Sterile water   Visualized foreign bodies/material removed: no   Skin repair:    Repair method:  Sutures   Suture size:  4-0   Suture material:  Prolene   Number of sutures:  2 Approximation:    Approximation:  Loose Post-procedure details:    Dressing:  Antibiotic ointment, non-adherent dressing and adhesive bandage   Patient tolerance of procedure:  Tolerated well, no immediate complications Comments:     Buddy taped with middle finger.   (including critical care time)  Medications Ordered in ED Medications  lidocaine (PF) (XYLOCAINE) 1 % injection (not administered)  oxyCODONE-acetaminophen (PERCOCET/ROXICET) 5-325 MG per tablet 2 tablet (not administered)  ceFAZolin (ANCEF) IVPB 1 g/50 mL premix (1 g Intravenous New Bag/Given 12/31/16 1954)  Tdap (BOOSTRIX) injection 0.5 mL (0.5 mLs Intramuscular Given 12/31/16 1953)  Initial Impression / Assessment and Plan / ED Course  I have reviewed the triage vital signs and the nursing notes.  Pertinent labs & imaging results that were available during my care of the patient were reviewed by me and considered in my medical decision making (see chart for details).    Discussed with Dr. Mina Marble who saw the patient in the emergency department.  Advised soaking the wound in Betadine solution, loose closure, splinting, antibiotics and he will follow-up in the office for operative repair.  Patient was given dose of Ancef and had tetanus updated.   Final Clinical Impressions(s) / ED Diagnoses   Final diagnoses:  Open fracture of proximal phalanx of digit of left hand    ED Discharge Orders        Ordered    cephALEXin (KEFLEX) 500 MG capsule  3 times daily     12/31/16 2056    oxyCODONE-acetaminophen (PERCOCET) 5-325 MG tablet  Every 4 hours PRN     12/31/16 2056       Loren Racer, MD 12/31/16 [phone]

## 2016-12-31 NOTE — ED Notes (Signed)
Md at bedside to suture. 

## 2016-12-31 NOTE — ED Notes (Signed)
Patient left at this time with all belongings. 

## 2016-12-31 NOTE — ED Triage Notes (Signed)
Pt states he got left index finger caught in wood splitter ~40 mins PTA. Bleeding controlled.

## 2017-01-01 NOTE — ED Notes (Signed)
01/01/2017, Pt. Called and left a message on this Rn's office phone for a return call .  Pt. Needed information on discharge instructions.  Returned pt.s call. No answer, unable to leave a message.

## 2017-01-03 ENCOUNTER — Encounter (HOSPITAL_BASED_OUTPATIENT_CLINIC_OR_DEPARTMENT_OTHER): Payer: Self-pay | Admitting: *Deleted

## 2017-01-03 ENCOUNTER — Other Ambulatory Visit: Payer: Self-pay | Admitting: Orthopedic Surgery

## 2017-01-04 ENCOUNTER — Encounter (HOSPITAL_BASED_OUTPATIENT_CLINIC_OR_DEPARTMENT_OTHER)
Admission: RE | Disposition: A | Discharge status: Home / Self Care | Payer: Self-pay | Source: Ambulatory Visit | Attending: Orthopedic Surgery

## 2017-01-04 ENCOUNTER — Ambulatory Visit (HOSPITAL_BASED_OUTPATIENT_CLINIC_OR_DEPARTMENT_OTHER): Payer: Self-pay | Admitting: Certified Registered"

## 2017-01-04 ENCOUNTER — Other Ambulatory Visit: Payer: Self-pay

## 2017-01-04 ENCOUNTER — Encounter (HOSPITAL_BASED_OUTPATIENT_CLINIC_OR_DEPARTMENT_OTHER): Payer: Self-pay | Admitting: Anesthesiology

## 2017-01-04 ENCOUNTER — Ambulatory Visit (HOSPITAL_BASED_OUTPATIENT_CLINIC_OR_DEPARTMENT_OTHER)
Admission: RE | Admit: 2017-01-04 | Discharge: 2017-01-04 | Disposition: A | Discharge status: Home / Self Care | Payer: Self-pay | Source: Ambulatory Visit | Attending: Orthopedic Surgery | Admitting: Orthopedic Surgery

## 2017-01-04 DIAGNOSIS — W312XXA Contact with powered woodworking and forming machines, initial encounter: Secondary | ICD-10-CM | POA: Insufficient documentation

## 2017-01-04 DIAGNOSIS — I10 Essential (primary) hypertension: Secondary | ICD-10-CM | POA: Insufficient documentation

## 2017-01-04 DIAGNOSIS — Y9389 Activity, other specified: Secondary | ICD-10-CM | POA: Insufficient documentation

## 2017-01-04 DIAGNOSIS — Z79899 Other long term (current) drug therapy: Secondary | ICD-10-CM | POA: Insufficient documentation

## 2017-01-04 DIAGNOSIS — E7800 Pure hypercholesterolemia, unspecified: Secondary | ICD-10-CM | POA: Insufficient documentation

## 2017-01-04 DIAGNOSIS — F17210 Nicotine dependence, cigarettes, uncomplicated: Secondary | ICD-10-CM | POA: Insufficient documentation

## 2017-01-04 DIAGNOSIS — S62611A Displaced fracture of proximal phalanx of left index finger, initial encounter for closed fracture: Secondary | ICD-10-CM | POA: Insufficient documentation

## 2017-01-04 DIAGNOSIS — K219 Gastro-esophageal reflux disease without esophagitis: Secondary | ICD-10-CM | POA: Insufficient documentation

## 2017-01-04 DIAGNOSIS — S66321A Laceration of extensor muscle, fascia and tendon of left index finger at wrist and hand level, initial encounter: Secondary | ICD-10-CM | POA: Insufficient documentation

## 2017-01-04 HISTORY — PX: OPEN REDUCTION INTERNAL FIXATION (ORIF) METACARPAL: SHX6234

## 2017-01-04 SURGERY — OPEN REDUCTION INTERNAL FIXATION (ORIF) METACARPAL
Anesthesia: General | Site: Hand | Laterality: Left

## 2017-01-04 MED ORDER — PROPOFOL 10 MG/ML IV BOLUS
INTRAVENOUS | Status: AC
  Filled 2017-01-04: qty 20

## 2017-01-04 MED ORDER — ONDANSETRON HCL 4 MG/2ML IJ SOLN
INTRAMUSCULAR | Status: AC
  Filled 2017-01-04: qty 2

## 2017-01-04 MED ORDER — LACTATED RINGERS IV SOLN
INTRAVENOUS | Status: DC
  Administered 2017-01-04: 09:00:00 via INTRAVENOUS

## 2017-01-04 MED ORDER — FENTANYL CITRATE (PF) 100 MCG/2ML IJ SOLN
50.0000 ug | INTRAMUSCULAR | Status: AC | PRN
  Administered 2017-01-04: 100 ug via INTRAVENOUS
  Administered 2017-01-04 (×2): 50 ug via INTRAVENOUS

## 2017-01-04 MED ORDER — CEFAZOLIN SODIUM-DEXTROSE 2-4 GM/100ML-% IV SOLN: 2.0000 g | INTRAVENOUS | Status: DC

## 2017-01-04 MED ORDER — FENTANYL CITRATE (PF) 100 MCG/2ML IJ SOLN
INTRAMUSCULAR | Status: AC
  Filled 2017-01-04: qty 2

## 2017-01-04 MED ORDER — CEFAZOLIN SODIUM-DEXTROSE 2-4 GM/100ML-% IV SOLN
INTRAVENOUS | Status: AC
  Filled 2017-01-04: qty 100

## 2017-01-04 MED ORDER — DEXAMETHASONE SODIUM PHOSPHATE 10 MG/ML IJ SOLN
INTRAMUSCULAR | Status: AC
  Filled 2017-01-04: qty 1

## 2017-01-04 MED ORDER — OXYCODONE-ACETAMINOPHEN 5-325 MG PO TABS: 1.0000 | ORAL_TABLET | ORAL | 0 refills | Status: DC | PRN

## 2017-01-04 MED ORDER — CHLORHEXIDINE GLUCONATE 4 % EX LIQD: 60.0000 mL | Freq: Once | CUTANEOUS | Status: DC

## 2017-01-04 MED ORDER — DEXMEDETOMIDINE HCL IN NACL 200 MCG/50ML IV SOLN
INTRAVENOUS | Status: DC | PRN
  Administered 2017-01-04 (×2): 20 ug via INTRAVENOUS

## 2017-01-04 MED ORDER — BUPIVACAINE HCL (PF) 0.25 % IJ SOLN
INTRAMUSCULAR | Status: DC | PRN
  Administered 2017-01-04: 10 mL

## 2017-01-04 MED ORDER — MIDAZOLAM HCL 2 MG/2ML IJ SOLN
INTRAMUSCULAR | Status: AC
  Filled 2017-01-04: qty 2

## 2017-01-04 MED ORDER — DEXAMETHASONE SODIUM PHOSPHATE 10 MG/ML IJ SOLN
INTRAMUSCULAR | Status: DC | PRN
  Administered 2017-01-04: 10 mg via INTRAVENOUS

## 2017-01-04 MED ORDER — PROPOFOL 10 MG/ML IV BOLUS
INTRAVENOUS | Status: DC | PRN
  Administered 2017-01-04: 250 mg via INTRAVENOUS

## 2017-01-04 MED ORDER — LIDOCAINE HCL (CARDIAC) 20 MG/ML IV SOLN
INTRAVENOUS | Status: DC | PRN
  Administered 2017-01-04: 100 mg via INTRAVENOUS

## 2017-01-04 MED ORDER — MIDAZOLAM HCL 2 MG/2ML IJ SOLN
1.0000 mg | INTRAMUSCULAR | Status: DC | PRN
  Administered 2017-01-04: 2 mg via INTRAVENOUS

## 2017-01-04 MED ORDER — SCOPOLAMINE 1 MG/3DAYS TD PT72: 1.0000 | MEDICATED_PATCH | Freq: Once | TRANSDERMAL | Status: DC | PRN

## 2017-01-04 MED ORDER — ONDANSETRON HCL 4 MG/2ML IJ SOLN
INTRAMUSCULAR | Status: DC | PRN
  Administered 2017-01-04: 4 mg via INTRAVENOUS

## 2017-01-04 SURGICAL SUPPLY — 80 items
APL SKNCLS STERI-STRIP NONHPOA (GAUZE/BANDAGES/DRESSINGS) ×2
BANDAGE ACE 3X5.8 VEL STRL LF (GAUZE/BANDAGES/DRESSINGS) ×1 IMPLANT
BANDAGE ACE 4X5 VEL STRL LF (GAUZE/BANDAGES/DRESSINGS) ×4 IMPLANT
BENZOIN TINCTURE PRP APPL 2/3 (GAUZE/BANDAGES/DRESSINGS) ×3 IMPLANT
BIT DRILL 1.1 MINI (BIT) ×1 IMPLANT
BLADE SURG 15 STRL LF DISP TIS (BLADE) ×2 IMPLANT
BLADE SURG 15 STRL SS (BLADE) ×4
BNDG CMPR 9X4 STRL LF SNTH (GAUZE/BANDAGES/DRESSINGS) ×2
BNDG COHESIVE 1X5 TAN STRL LF (GAUZE/BANDAGES/DRESSINGS) IMPLANT
BNDG ELASTIC 2X5.8 VLCR STR LF (GAUZE/BANDAGES/DRESSINGS) IMPLANT
BNDG ESMARK 4X9 LF (GAUZE/BANDAGES/DRESSINGS) ×3 IMPLANT
BNDG GAUZE ELAST 4 BULKY (GAUZE/BANDAGES/DRESSINGS) ×3 IMPLANT
CANISTER SUCT 1200ML W/VALVE (MISCELLANEOUS) IMPLANT
CLOSURE WOUND 1/2 X4 (GAUZE/BANDAGES/DRESSINGS) ×1
CORD BIPOLAR FORCEPS 12FT (ELECTRODE) ×3 IMPLANT
COVER BACK TABLE 60X90IN (DRAPES) ×4 IMPLANT
CUFF TOURNIQUET SINGLE 18IN (TOURNIQUET CUFF) ×3 IMPLANT
DECANTER SPIKE VIAL GLASS SM (MISCELLANEOUS) IMPLANT
DRAPE EXTREMITY T 121X128X90 (DRAPE) ×4 IMPLANT
DRAPE OEC MINIVIEW 54X84 (DRAPES) ×4 IMPLANT
DRAPE SURG 17X23 STRL (DRAPES) ×4 IMPLANT
DRILL BIT 1.1 MINI (BIT) ×4
DURAPREP 26ML APPLICATOR (WOUND CARE) ×4 IMPLANT
GAUZE SPONGE 4X4 12PLY STRL (GAUZE/BANDAGES/DRESSINGS) ×4 IMPLANT
GAUZE SPONGE 4X4 16PLY XRAY LF (GAUZE/BANDAGES/DRESSINGS) IMPLANT
GAUZE XEROFORM 1X8 LF (GAUZE/BANDAGES/DRESSINGS) ×3 IMPLANT
GLOVE BIOGEL PI IND STRL 7.0 (GLOVE) ×1 IMPLANT
GLOVE BIOGEL PI IND STRL 8 (GLOVE) ×1 IMPLANT
GLOVE BIOGEL PI INDICATOR 7.0 (GLOVE) ×2
GLOVE BIOGEL PI INDICATOR 8 (GLOVE) ×2
GLOVE ECLIPSE 6.5 STRL STRAW (GLOVE) ×3 IMPLANT
GLOVE SURG SYN 8.0 (GLOVE) ×4 IMPLANT
GLOVE SURG SYN 8.0 PF PI (GLOVE) ×1 IMPLANT
GOWN STRL REUS W/ TWL LRG LVL3 (GOWN DISPOSABLE) ×2 IMPLANT
GOWN STRL REUS W/TWL LRG LVL3 (GOWN DISPOSABLE) ×4
GOWN STRL REUS W/TWL XL LVL3 (GOWN DISPOSABLE) ×4 IMPLANT
NDL HYPO 25X1 1.5 SAFETY (NEEDLE) IMPLANT
NEEDLE HYPO 25X1 1.5 SAFETY (NEEDLE) ×4 IMPLANT
NS IRRIG 1000ML POUR BTL (IV SOLUTION) IMPLANT
PACK BASIN DAY SURGERY FS (CUSTOM PROCEDURE TRAY) ×4 IMPLANT
PAD CAST 3X4 CTTN HI CHSV (CAST SUPPLIES) ×2 IMPLANT
PAD CAST 4YDX4 CTTN HI CHSV (CAST SUPPLIES) IMPLANT
PADDING CAST ABS 4INX4YD NS (CAST SUPPLIES) ×2
PADDING CAST ABS COTTON 4X4 ST (CAST SUPPLIES) ×2 IMPLANT
PADDING CAST COTTON 3X4 STRL (CAST SUPPLIES) ×4
PADDING CAST COTTON 4X4 STRL (CAST SUPPLIES)
PADDING UNDERCAST 2 STRL (CAST SUPPLIES) ×2
PADDING UNDERCAST 2X4 STRL (CAST SUPPLIES) ×2 IMPLANT
PLATE Y 1.5 3H HD/8H SFT (Plate) ×1 IMPLANT
PLATE-7 1.5 3H HD/8H SFT (Plate) ×4 IMPLANT
SCREW CORTEX 1.5X10 (Screw) ×3 IMPLANT
SCREW CORTEX 1.5X12 (Screw) ×3 IMPLANT
SCREW CORTEX 1.5X16 (Screw) ×3 IMPLANT
SCREW CORTEX 1.5X8 (Screw) ×3 IMPLANT
SCREW CORTEX 1.5X9 (Screw) ×3 IMPLANT
SCREW SELF TAP CORTEX 1.5 13MM (Screw) ×6 IMPLANT
SHEET MEDIUM DRAPE 40X70 STRL (DRAPES) ×4 IMPLANT
SLEEVE SCD COMPRESS KNEE MED (MISCELLANEOUS) ×3 IMPLANT
SPLINT PLASTER CAST XFAST 4X15 (CAST SUPPLIES) IMPLANT
SPLINT PLASTER XTRA FAST SET 4 (CAST SUPPLIES)
STOCKINETTE 4X48 STRL (DRAPES) ×4 IMPLANT
STRIP CLOSURE SKIN 1/2X4 (GAUZE/BANDAGES/DRESSINGS) ×2 IMPLANT
SUCTION FRAZIER HANDLE 10FR (MISCELLANEOUS)
SUCTION TUBE FRAZIER 10FR DISP (MISCELLANEOUS) IMPLANT
SUT ETHILON 4 0 PS 2 18 (SUTURE) ×6 IMPLANT
SUT ETHILON 5 0 PS 2 18 (SUTURE) IMPLANT
SUT MERSILENE 4 0 P 3 (SUTURE) IMPLANT
SUT PROLENE 3 0 PS 2 (SUTURE) IMPLANT
SUT VIC AB 4-0 P-3 18XBRD (SUTURE) IMPLANT
SUT VIC AB 4-0 P3 18 (SUTURE)
SUT VICRYL 4-0 PS2 18IN ABS (SUTURE) IMPLANT
SUT VICRYL RAPIDE 4-0 (SUTURE) IMPLANT
SUT VICRYL RAPIDE 4/0 PS 2 (SUTURE) IMPLANT
SUT VICRYL+ 3-0 27IN RB-1 (SUTURE) IMPLANT
SYR 10ML LL (SYRINGE) ×3 IMPLANT
SYR BULB 3OZ (MISCELLANEOUS) ×3 IMPLANT
TOWEL OR 17X24 6PK STRL BLUE (TOWEL DISPOSABLE) ×4 IMPLANT
TUBE CONNECTING 20'X1/4 (TUBING)
TUBE CONNECTING 20X1/4 (TUBING) IMPLANT
UNDERPAD 30X30 (UNDERPADS AND DIAPERS) ×4 IMPLANT

## 2017-01-04 NOTE — Transfer of Care (Signed)
Immediate Anesthesia Transfer of Care Note  Patient: Philip Cox  Procedure(s) Performed: OPEN REDUCTION INTERNAL FIXATION (ORIF) LEFT INDEX P-1 (Left Hand)  Patient Location: PACU  Anesthesia Type:General  Level of Consciousness: awake, drowsy and patient cooperative  Airway & Oxygen Therapy: Patient Spontanous Breathing and Patient connected to face mask oxygen  Post-op Assessment: Report given to RN and Post -op Vital signs reviewed and stable  Post vital signs: Reviewed and stable  Last Vitals:  Vitals:   01/04/17 0915  BP: (!) 149/94  Pulse: 61  Resp: 20  Temp: 36.9 C  SpO2: 100%    Last Pain:  Vitals:   01/04/17 0915  TempSrc: Oral  PainSc: 7          Complications: No apparent anesthesia complications

## 2017-01-04 NOTE — Op Note (Signed)
Please see dictated report [phone]

## 2017-01-04 NOTE — Discharge Instructions (Signed)
Regional Anesthesia Blocks ? ?1. Numbness or the inability to move the "blocked" extremity may last from 3-48 hours after placement. The length of time depends on the medication injected and your individual response to the medication. If the numbness is not going away after 48 hours, call your surgeon. ? ?2. The extremity that is blocked will need to be protected until the numbness is gone and the  Strength has returned. Because you cannot feel it, you will need to take extra care to avoid injury. Because it may be weak, you may have difficulty moving it or using it. You may not know what position it is in without looking at it while the block is in effect. ? ?3. For blocks in the legs and feet, returning to weight bearing and walking needs to be done carefully. You will need to wait until the numbness is entirely gone and the strength has returned. You should be able to move your leg and foot normally before you try and bear weight or walk. You will need someone to be with you when you first try to ensure you do not fall and possibly risk injury. ? ?4. Bruising and tenderness at the needle site are common side effects and will resolve in a few days. ? ?5. Persistent numbness or new problems with movement should be communicated to the surgeon or the [facility] ([phone])/ [facility] ([phone]).  ? ?Post Anesthesia Home Care Instructions ? ?Activity: ?Get plenty of rest for the remainder of the day. A responsible individual must stay with you for 24 hours following the procedure.  ?For the next 24 hours, DO NOT: ?-Drive a car ?-Operate machinery ?-Drink alcoholic beverages ?-Take any medication unless instructed by your physician ?-Make any legal decisions or sign important papers. ? ?Meals: ?Start with liquid foods such as gelatin or soup. Progress to regular foods as tolerated. Avoid greasy, spicy, heavy foods. If nausea and/or vomiting occur, drink only clear liquids until the  nausea and/or vomiting subsides. Call your physician if vomiting continues. ? ?Special Instructions/Symptoms: ?Your throat may feel dry or sore from the anesthesia or the breathing tube placed in your throat during surgery. If this causes discomfort, gargle with warm salt water. The discomfort should disappear within 24 hours. ? ?If you had a scopolamine patch placed behind your ear for the management of post- operative nausea and/or vomiting: ? ?1. The medication in the patch is effective for 72 hours, after which it should be removed.  Wrap patch in a tissue and discard in the trash. Wash hands thoroughly with soap and water. ?2. You may remove the patch earlier than 72 hours if you experience unpleasant side effects which may include dry mouth, dizziness or visual disturbances. ?3. Avoid touching the patch. Wash your hands with soap and water after contact with the patch. ?    ?

## 2017-01-04 NOTE — Anesthesia Preprocedure Evaluation (Addendum)
Anesthesia Evaluation  Patient identified by MRN, date of birth, ID band Patient awake    Reviewed: Allergy & Precautions, NPO status , Patient's Chart, lab work & pertinent test results  Airway Mallampati: I  TM Distance: >3 FB Neck ROM: Full    Dental  (+) Teeth Intact, Dental Advisory Given   Pulmonary Current Smoker,    breath sounds clear to auscultation       Cardiovascular hypertension,  Rhythm:Regular Rate:Normal     Neuro/Psych  Headaches, negative psych ROS   GI/Hepatic Neg liver ROS, GERD  Medicated,  Endo/Other  negative endocrine ROS  Renal/GU negative Renal ROS  negative genitourinary   Musculoskeletal negative musculoskeletal ROS (+)   Abdominal Normal abdominal exam  (+)   Peds negative pediatric ROS (+)  Hematology negative hematology ROS (+)   Anesthesia Other Findings   Reproductive/Obstetrics negative OB ROS                           Anesthesia Physical Anesthesia Plan  ASA: II  Anesthesia Plan: General   Post-op Pain Management:    Induction: Intravenous  PONV Risk Score and Plan: 2 and Ondansetron, Dexamethasone and Midazolam  Airway Management Planned: LMA  Additional Equipment:   Intra-op Plan:   Post-operative Plan: Extubation in OR  Informed Consent: I have reviewed the patients History and Physical, chart, labs and discussed the procedure including the risks, benefits and alternatives for the proposed anesthesia with the patient or authorized representative who has indicated his/her understanding and acceptance.   Dental advisory given  Plan Discussed with: CRNA  Anesthesia Plan Comments:         Anesthesia Quick Evaluation

## 2017-01-04 NOTE — Anesthesia Procedure Notes (Signed)
Procedure Name: LMA Insertion Performed by: Karen KitchensKelly, Lorae Roig M, CRNA Pre-anesthesia Checklist: Patient identified, Suction available, Emergency Drugs available, Patient being monitored and Timeout performed Preoxygenation: Pre-oxygenation with 100% oxygen Induction Type: IV induction LMA: LMA inserted LMA Size: 4.0 Tube type: Oral Number of attempts: 1 Placement Confirmation: positive ETCO2,  CO2 detector and breath sounds checked- equal and bilateral Tube secured with: Tape Dental Injury: Teeth and Oropharynx as per pre-operative assessment

## 2017-01-04 NOTE — H&P (Signed)
Philip Cox is an 41 y.o. male.   Chief Complaint: Left index finger pain and deformity HPI: Patient's a very pleasant 41 year old right-hand-dominant male who sustained a crush injury to his nondominant left index finger while using a wood splitter this past Monday evening which resulted in a fracture of the proximal phalanx of his nondominant left index finger.  Past Medical History:  Diagnosis Date  . GERD (gastroesophageal reflux disease)   . Hypercholesteremia   . Hypertension   . Iritis, chronic   . Seasonal allergies     Past Surgical History:  Procedure Laterality Date  . APPENDECTOMY    . KNEE SURGERY     right    History reviewed. No pertinent family history. Social History:  reports that he has been smoking cigarettes.  He has smoked for the past 5.00 years. he has never used smokeless tobacco. He reports that he drinks alcohol. He reports that he does not use drugs.  Allergies: No Known Allergies  Medications Prior to Admission  Medication Sig Dispense Refill  . cephALEXin (KEFLEX) 500 MG capsule Take 1 capsule (500 mg total) by mouth 3 (three) times daily. 21 capsule 0  . esomeprazole (NEXIUM) 20 MG capsule Take 20 mg by mouth every other day.    . Multiple Vitamins-Minerals (MULTIVITAMINS THER. W/MINERALS) TABS Take 1 tablet by mouth daily.      Marland Kitchen. oxyCODONE-acetaminophen (PERCOCET) 5-325 MG tablet Take 1-2 tablets by mouth every 4 (four) hours as needed for severe pain. 15 tablet 0    No results found for this or any previous visit (from the past 48 hour(s)). No results found.  Review of Systems  All other systems reviewed and are negative.   Blood pressure (!) 149/94, pulse 61, temperature 98.5 F (36.9 C), temperature source Oral, resp. rate 20, height 6\' 3"  (1.905 m), weight 114.8 kg (253 lb), SpO2 100 %. Physical Exam  Constitutional: He is oriented to person, place, and time. He appears well-developed and well-nourished.  HENT:  Head: Normocephalic  and atraumatic.  Neck: Normal range of motion.  Cardiovascular: Normal rate.  Respiratory: Effort normal.  Musculoskeletal:       Left hand: He exhibits decreased range of motion, tenderness, bony tenderness and deformity.  Left index finger pain, swelling, and deformity status post crush injury  Neurological: He is alert and oriented to person, place, and time.  Skin: Skin is warm.  Psychiatric: He has a normal mood and affect. His behavior is normal. Judgment and thought content normal.     Assessment/Plan Patient's a very pleasant 41 year old male with a fracture of the proximal phalanx of his nondominant left index finger. We discussed treatment options to include closed reduction and pinning versus open reduction and internal fixation with plate and screws as an outpatient. Patient understands the risks and benefits and wished to proceed.  Marlowe ShoresMatthew A Kerstie Agent, MD 01/04/2017, 10:57 AM

## 2017-01-04 NOTE — Anesthesia Postprocedure Evaluation (Signed)
Anesthesia Post Note  Patient: Philip Cox L Sutherlin  Procedure(s) Performed: OPEN REDUCTION INTERNAL FIXATION (ORIF) LEFT INDEX P-1 (Left Hand)     Patient location during evaluation: PACU Anesthesia Type: General Level of consciousness: awake and alert Pain management: pain level controlled Vital Signs Assessment: post-procedure vital signs reviewed and stable Respiratory status: spontaneous breathing, nonlabored ventilation, respiratory function stable and patient connected to nasal cannula oxygen Cardiovascular status: blood pressure returned to baseline and stable Postop Assessment: no apparent nausea or vomiting Anesthetic complications: no    Last Vitals:  Vitals:   01/04/17 1330 01/04/17 1345  BP: 133/87 110/70  Pulse: (!) 57 (!) 56  Resp: 17 18  Temp:  (!) 36.4 C  SpO2: 96% 100%    Last Pain:  Vitals:   01/04/17 1345  TempSrc:   PainSc: 0-No pain                 Shelton SilvasKevin D Jahzion Brogden

## 2017-01-07 ENCOUNTER — Encounter (HOSPITAL_BASED_OUTPATIENT_CLINIC_OR_DEPARTMENT_OTHER): Payer: Self-pay | Admitting: Orthopedic Surgery

## 2017-01-07 NOTE — Op Note (Signed)
NAMTempie Hoist:  Bogosian, Macario                ACCOUNT NO.:  [id number]  MEDICAL RECORD NO.:  [id number]  LOCATION:                                 FACILITY:  PHYSICIAN:  Artist PaisMatthew A. Mina MarbleWeingold, M.D.   DATE OF BIRTH:  DATE OF PROCEDURE:  01/04/2017 DATE OF DISCHARGE:                              OPERATIVE REPORT   PREOPERATIVE DIAGNOSIS:  Crush injury of left index finger with phalangeal fracture and extensor tendon injury.  PREOPERATIVE DIAGNOSIS:  Crush injury of left index finger with phalangeal fracture and extensor tendon injury.  PROCEDURE:  Exploration of above with open reduction and internal fixation of proximal phalangeal fracture of left index finger with extensor tendon repair.  SURGEON:  Artist PaisMatthew A. Mina MarbleWeingold, MD.  ASSISTANT:  None.  ANESTHESIA:  General.  COMPLICATION:  None.  DRAINS:  None.  The patient was taken to the operating suite after induction of adequate general anesthetic.  Left upper extremity was prepped and draped in the usual sterile fashion.  An Esmarch was used to exsanguinate the limb. The tourniquet was then inflated to 250 mmHg.  At this point in time, the index finger on the left was approached surgically where a previous laceration that was closed in the emergency department was reopened and extended proximally and distally and a radially-based flap was elevated off the proximal phalanx of the index finger on the left.  There was a complex laceration to the extensor mechanism, which we extended proximally and distally to gain exposure to the fracture site.  The fracture site at the metaphyseal-diaphyseal flare of the proximal phalanx was debrided of clot and nonviable material and thoroughly irrigated.  We then fixed this fracture with a 1.5-mm T-plate that was originally 8 holes.  So, we cut to 4 holes distally and 3 proximally. We placed 3 cortical screws in T-fashion proximal and 3 distal onto the shaft distally to realign and reduce the fracture.   Fluoroscopic imaging revealed adequate reduction on both AP, lateral, and oblique views.  The wound was then irrigated.  We then repaired the extensor mechanism using 4-0 FiberWire in a combination of interrupted horizontal mattress sutures and a running locked epitendinous-type stitch distally.  We then closed the skin with 4-0 nylon.  Prior to repairing the extensor mechanism, we covered the plate with the periosteum using 4-0 Vicryl.  At the end of procedure, Xeroform, 4 x 4's, and a dorsal extension block splint were applied.  The patient tolerated this procedure well and went to the recovery room in stable fashion.     Artist PaisMatthew A. Mina MarbleWeingold, M.D.     MAW/MEDQ  D:  01/04/2017  T:  01/05/2017  Job:  [id number]

## 2017-03-25 ENCOUNTER — Other Ambulatory Visit: Payer: Self-pay

## 2017-03-25 ENCOUNTER — Emergency Department (HOSPITAL_COMMUNITY): Payer: Self-pay

## 2017-03-25 ENCOUNTER — Encounter (HOSPITAL_COMMUNITY): Payer: Self-pay

## 2017-03-25 ENCOUNTER — Emergency Department (HOSPITAL_COMMUNITY)
Admission: EM | Admit: 2017-03-25 | Discharge: 2017-03-25 | Disposition: A | Discharge status: Home / Self Care | Payer: Self-pay | Attending: Emergency Medicine | Admitting: Emergency Medicine

## 2017-03-25 DIAGNOSIS — F17210 Nicotine dependence, cigarettes, uncomplicated: Secondary | ICD-10-CM | POA: Insufficient documentation

## 2017-03-25 DIAGNOSIS — R059 Cough, unspecified: Secondary | ICD-10-CM

## 2017-03-25 DIAGNOSIS — R05 Cough: Secondary | ICD-10-CM | POA: Insufficient documentation

## 2017-03-25 DIAGNOSIS — Z79899 Other long term (current) drug therapy: Secondary | ICD-10-CM | POA: Insufficient documentation

## 2017-03-25 DIAGNOSIS — E7800 Pure hypercholesterolemia, unspecified: Secondary | ICD-10-CM | POA: Insufficient documentation

## 2017-03-25 DIAGNOSIS — I10 Essential (primary) hypertension: Secondary | ICD-10-CM | POA: Insufficient documentation

## 2017-03-25 MED ORDER — PREDNISONE 10 MG (21) PO TBPK: ORAL_TABLET | ORAL | 0 refills | Status: AC

## 2017-03-25 MED ORDER — ALBUTEROL SULFATE HFA 108 (90 BASE) MCG/ACT IN AERS: 2.0000 | INHALATION_SPRAY | RESPIRATORY_TRACT | 0 refills | Status: AC | PRN

## 2017-03-25 MED ORDER — BENZONATATE 100 MG PO CAPS: 100.0000 mg | ORAL_CAPSULE | Freq: Three times a day (TID) | ORAL | 0 refills | Status: DC

## 2017-03-25 NOTE — Discharge Instructions (Signed)
There were no acute abnormalities on the chest x-ray.  Hand washing: Wash your hands throughout the day, but especially before and after touching the face, using the restroom, sneezing, coughing, or touching surfaces that have been coughed or sneezed upon. Hydration: Symptoms will be intensified and complicated by dehydration. Dehydration can also extend the duration of symptoms. Drink plenty of fluids and get plenty of rest. You should be drinking at least half a liter of water an hour to stay hydrated. Electrolyte drinks (ex. Gatorade, Powerade, Pedialyte) are also encouraged. You should be drinking enough fluids to make your urine light yellow, almost clear. If this is not the case, you are not drinking enough water. Please note that some of the treatments indicated below will not be effective if you are not adequately hydrated. Pain or fever: Ibuprofen, Naproxen, or Tylenol for pain or fever.  Cough: Use the Tessalon for cough.  Albuterol: May use the albuterol as needed for instances of shortness of breath. Prednisone: Take the prednisone, as directed, in its entirety. Zyrtec or Claritin: May add these medication daily to control underlying symptoms of congestion, sneezing, and other signs of allergies. Flonase: Use this medication, as directed, for nasal and sinus congestion. Follow up: Follow up with a primary care provider, as needed, for any future management of this issue.  Your blood pressure was abnormally high today. You should follow up with your primary care provider as soon as possible on this matter.

## 2017-03-25 NOTE — ED Provider Notes (Signed)
[facility] EMERGENCY DEPARTMENT Provider Note   CSN: [id number] Arrival date & time: 03/25/17  1305     History   Chief Complaint Chief Complaint  Patient presents with  . Cough    HPI Philip Cox is a 42 y.o. male.  HPI   Philip Cox is a 42 y.o. male, with a history of GERD, HTN, and hypercholesterolemia, presenting to the ED with nonproductive cough for the past month.  Cough is worse in the cold weather.  States he thinks it may be improving.  Has increased his smoking over the past several months.  Has tried a friend's Qvar inhaler without improvement.  Denies shortness of breath, chest pain, fever/chills, nasal congestion, postnasal drip, N/V/D, peripheral edema, or any other complaints.    Past Medical History:  Diagnosis Date  . GERD (gastroesophageal reflux disease)   . Hypercholesteremia   . Hypertension   . Iritis, chronic   . Seasonal allergies     Patient Active Problem List   Diagnosis Date Noted  . Obesity, unspecified 10/27/2012  . HYPERTENSION 10/27/2009  . GERD 10/27/2009  . HEADACHE 10/27/2009  . NEPHROLITHIASIS, HX OF 10/27/2009    Past Surgical History:  Procedure Laterality Date  . APPENDECTOMY    . KNEE SURGERY     right  . OPEN REDUCTION INTERNAL FIXATION (ORIF) METACARPAL Left 01/04/2017   Procedure: OPEN REDUCTION INTERNAL FIXATION (ORIF) LEFT INDEX P-1;  Surgeon: Dairl Ponder, MD;  Location: [facility];  Service: Orthopedics;  Laterality: Left;       Home Medications    Prior to Admission medications   Medication Sig Start Date End Date Taking? Authorizing Provider  albuterol (PROVENTIL HFA;VENTOLIN HFA) 108 (90 Base) MCG/ACT inhaler Inhale 2 puffs into the lungs every 4 (four) hours as needed for wheezing or shortness of breath. 03/25/17   Urijah Arko C, PA-C  benzonatate (TESSALON) 100 MG capsule Take 1 capsule (100 mg total) by mouth every 8 (eight) hours. 03/25/17   Raynaldo Falco C, PA-C    esomeprazole (NEXIUM) 20 MG capsule Take 20 mg by mouth every other day.    [provider]  Multiple Vitamins-Minerals (MULTIVITAMINS THER. W/MINERALS) TABS Take 1 tablet by mouth daily.      [provider]  oxyCODONE-acetaminophen (PERCOCET) 5-325 MG tablet Take 1-2 tablets by mouth every 4 (four) hours as needed for severe pain. 12/31/16   Loren Racer, MD  oxyCODONE-acetaminophen (ROXICET) 5-325 MG tablet Take 1 tablet by mouth every 4 (four) hours as needed for severe pain. 01/04/17   Dairl Ponder, MD  predniSONE (STERAPRED UNI-PAK 21 TAB) 10 MG (21) TBPK tablet Take 6 tabs (60mg ) on day 1, 5 tabs (50mg ) on day 2, 4 tabs (40mg ) on day 3, 3 tabs (30mg ) on day 4, 2 tabs (20mg ) on day 5, and 1 tab (10mg ) on day 6. 03/25/17   Navi Ewton, Hillard Danker, PA-C    Family History No family history on file.  Social History Social History   Tobacco Use  . Smoking status: Current Some Day Smoker    Years: 5.00    Types: Cigarettes  . Smokeless tobacco: Never Used  Substance Use Topics  . Alcohol use: Yes    Alcohol/week: 0.0 oz  . Drug use: No     Allergies   Patient has no known allergies.   Review of Systems Review of Systems  Constitutional: Negative for chills and fever.  HENT: Negative for congestion.   Respiratory:  Positive for cough. Negative for shortness of breath.   Cardiovascular: Negative for chest pain, palpitations and leg swelling.  Gastrointestinal: Negative for abdominal pain, diarrhea, nausea and vomiting.  All other systems reviewed and are negative.    Physical Exam Updated Vital Signs BP (!) 176/111   Pulse 81   Temp 98.9 F (37.2 C)   Resp 19   Wt 114.8 kg (253 lb)   SpO2 97%   BMI 31.62 kg/m   Physical Exam  Constitutional: He appears well-developed and well-nourished. No distress.  HENT:  Head: Normocephalic and atraumatic.  Eyes: Conjunctivae are normal.  Neck: Neck supple.  Cardiovascular: Normal rate, regular rhythm, normal  heart sounds and intact distal pulses.  Pulmonary/Chest: Effort normal and breath sounds normal. No respiratory distress.  Patient shows no increased work of breathing.  Speaks in full sentences without difficulty.  No coughing noted during interaction with the patient.  Abdominal: Soft. There is no tenderness. There is no guarding.  Musculoskeletal: He exhibits no edema.  Lymphadenopathy:    He has no cervical adenopathy.  Neurological: He is alert.  Skin: Skin is warm and dry. He is not diaphoretic.  Psychiatric: He has a normal mood and affect. His behavior is normal.  Nursing note and vitals reviewed.    ED Treatments / Results  Labs (all labs ordered are listed, but only abnormal results are displayed) Labs Reviewed - No data to display  EKG  EKG Interpretation None       Radiology Dg Chest 2 View  Result Date: 03/25/2017 CLINICAL DATA:  Persistent cough EXAM: CHEST  2 VIEW COMPARISON:  None. FINDINGS: The heart size and mediastinal contours are within normal limits. Both lungs are clear. The visualized skeletal structures show degenerative change of the thoracic spine. IMPRESSION: No active cardiopulmonary disease. Electronically Signed   By: Alcide CleverMark  Lukens M.D.   On: 03/25/2017 15:42    Procedures Procedures (including critical care time)  Medications Ordered in ED Medications - No data to display   Initial Impression / Assessment and Plan / ED Course  I have reviewed the triage vital signs and the nursing notes.  Pertinent labs & imaging results that were available during my care of the patient were reviewed by me and considered in my medical decision making (see chart for details).  Clinical Course as of Mar 25 1814  Mon Mar 25, 2017  1704 Patient not yet bedded.  [SJ]    Clinical Course User Index [SJ] Cleaster Shiffer C, PA-C    Patient presents with persistent cough. Patient is nontoxic appearing, afebrile, not tachycardic, not tachypneic, not hypotensive, SPO2  of 97% on room air, and is in no apparent distress.  Chest x-ray without acute abnormality.  PCP follow-up for further management. The patient was given instructions for home care as well as return precautions. Patient voices understanding of these instructions, accepts the plan, and is comfortable with discharge.   Vitals:   03/25/17 1435 03/25/17 1436 03/25/17 1814  BP: (!) 176/111  (!) 175/120  Pulse: 81  70  Resp: 19    Temp: 98.9 F (37.2 C)  98.3 F (36.8 C)  TempSrc:   Oral  SpO2: 97%  99%  Weight:  114.8 kg (253 lb)    Hypertension noted. Patient is asymptomatic to this at this time.  Doubt hypertensive emergency.  When this was brought to the patient's attention, he states this is "normal" for him. Strongly encouraged patient to follow up with a primary  care provider on this matter.    Final Clinical Impressions(s) / ED Diagnoses   Final diagnoses:  Cough    ED Discharge Orders        Ordered    benzonatate (TESSALON) 100 MG capsule  Every 8 hours     03/25/17 1808    albuterol (PROVENTIL HFA;VENTOLIN HFA) 108 (90 Base) MCG/ACT inhaler  Every 4 hours PRN     03/25/17 1808    predniSONE (STERAPRED UNI-PAK 21 TAB) 10 MG (21) TBPK tablet     03/25/17 1808       Anselm Pancoast, PA-C 03/25/17 1817    Linwood Dibbles, MD 03/26/17 [phone]

## 2017-03-25 NOTE — ED Triage Notes (Signed)
Pt presents to the ed with complaints of cough x 1 month. States that he has lumps on the back of his head and had some swelling to his mouth yesterday that has stopped and is back to normal.

## 2017-03-25 NOTE — ED Provider Notes (Signed)
Patient placed in Quick Look pathway, seen and evaluated   Chief Complaint: Cough  HPI:   Philip Cox is a 42 y.o. male, with a history of HTN, presenting to the ED with persistent cough for last month. Largely nonproductive. Seems to be improving. Endorses increased smoking over last few months. Denies CP, SOB, fever/chills, congestion, N/V/D, or any other complaints.   ROS: Cough (one)  Physical Exam:   Gen: No distress  Neuro: Awake and Alert  Skin: Warm    Focused Exam:   No diaphoresis.  No pallor. Pulmonary: No increased work of breathing.  Speaks in full sentences without difficulty.  No tachypnea. Expiratory wheezing in right lower lung field. Cardiac: Normal rate and regular Abdominal: No abdominal tenderness.  No peritoneal signs.  No rebound tenderness.  No guarding.  No CVA tenderness. MSK: No peripheral edema.  PERC negative.  Initiation of care has begun. The patient has been counseled on the process, plan, and necessity for staying for the completion/evaluation, and the remainder of the medical screening examination   Concepcion LivingJoy, Shawn C, PA-C 03/25/17 1444    Linwood DibblesKnapp, Jon, MD 03/26/17 1306

## 2017-04-08 ENCOUNTER — Ambulatory Visit (HOSPITAL_COMMUNITY)
Admission: EM | Admit: 2017-04-08 | Discharge: 2017-04-08 | Disposition: A | Discharge status: Home / Self Care | Payer: Self-pay | Attending: Emergency Medicine | Admitting: Emergency Medicine

## 2017-04-08 ENCOUNTER — Encounter (HOSPITAL_COMMUNITY): Payer: Self-pay | Admitting: Emergency Medicine

## 2017-04-08 DIAGNOSIS — I10 Essential (primary) hypertension: Secondary | ICD-10-CM

## 2017-04-08 DIAGNOSIS — R05 Cough: Secondary | ICD-10-CM

## 2017-04-08 DIAGNOSIS — R21 Rash and other nonspecific skin eruption: Secondary | ICD-10-CM

## 2017-04-08 DIAGNOSIS — W57XXXA Bitten or stung by nonvenomous insect and other nonvenomous arthropods, initial encounter: Secondary | ICD-10-CM

## 2017-04-08 DIAGNOSIS — R059 Cough, unspecified: Secondary | ICD-10-CM

## 2017-04-08 MED ORDER — BENZONATATE 100 MG PO CAPS: 100.0000 mg | ORAL_CAPSULE | Freq: Three times a day (TID) | ORAL | 0 refills | Status: AC | PRN

## 2017-04-08 MED ORDER — DOXYCYCLINE HYCLATE 100 MG PO CAPS: 100.0000 mg | ORAL_CAPSULE | Freq: Two times a day (BID) | ORAL | 0 refills | Status: AC

## 2017-04-08 NOTE — Discharge Instructions (Addendum)
Recommend start Doxycycline 100mg  twice a day for 10 days. Recommend start Prednisone 10mg  tablets- take 6 tablets today then decrease by 1 tablet each day until finished on day 6 (fill the written Rx you have from your ER visit). May continue Benadryl as needed for itching/bites. May take Tessalon cough pills- 1 every 8 hours as needed for cough. Recommend follow-up with [facility] within 1 week if symptoms persist.

## 2017-04-08 NOTE — ED Provider Notes (Signed)
[facility]    CSN: [id number] Arrival date & time: 04/08/17  1206     History   Chief Complaint Chief Complaint  Patient presents with  . Cough  . Insect Bite    HPI Philip Cox is a 42 y.o. male.   42 year old male presents with cough, nasal and chest congestion for about 2 months. Denies any fever, sore throat, chest pain, shortness of breath or GI symptoms. [place] Also noticed intermittent red "bites" that would come and go in various areas of his body. They would swell and then go down later that day. Bites/rash does not itch. No other family members ill. Did go to the ER on 03/25/2017 for evaluation and had a chest x-ray which was negative. Was given Rx for  Prednisone, Tessalon and Albuterol inhaler which he never filled due to financial reasons. He has used his mom's Qvar/Symbicort inhaler with some relief. He also continues to smoke cigarettes daily. He has a history of HTN, hyperlipidemia and GERD but not currently on any medication due to finances.    The history is provided by the patient.    Past Medical History:  Diagnosis Date  . GERD (gastroesophageal reflux disease)   . Hypercholesteremia   . Hypertension   . Iritis, chronic   . Seasonal allergies     Patient Active Problem List   Diagnosis Date Noted  . Obesity, unspecified 10/27/2012  . HYPERTENSION 10/27/2009  . GERD 10/27/2009  . HEADACHE 10/27/2009  . NEPHROLITHIASIS, HX OF 10/27/2009    Past Surgical History:  Procedure Laterality Date  . APPENDECTOMY    . KNEE SURGERY     right  . OPEN REDUCTION INTERNAL FIXATION (ORIF) METACARPAL Left 01/04/2017   Procedure: OPEN REDUCTION INTERNAL FIXATION (ORIF) LEFT INDEX P-1;  Surgeon: Dairl Ponder, MD;  Location: [facility];  Service: Orthopedics;  Laterality: Left;       Home Medications    Prior to Admission medications   Medication Sig Start Date End Date Taking?  Authorizing Provider  albuterol (PROVENTIL HFA;VENTOLIN HFA) 108 (90 Base) MCG/ACT inhaler Inhale 2 puffs into the lungs every 4 (four) hours as needed for wheezing or shortness of breath. 03/25/17   Joy, Shawn C, PA-C  benzonatate (TESSALON) 100 MG capsule Take 1 capsule (100 mg total) by mouth 3 (three) times daily as needed for cough. 04/08/17   Sudie Grumbling, NP  doxycycline (VIBRAMYCIN) 100 MG capsule Take 1 capsule (100 mg total) by mouth 2 (two) times daily for 10 days. 04/08/17 04/18/17  Sudie Grumbling, NP  Multiple Vitamins-Minerals (MULTIVITAMINS THER. W/MINERALS) TABS Take 1 tablet by mouth daily.      [provider]  predniSONE (STERAPRED UNI-PAK 21 TAB) 10 MG (21) TBPK tablet Take 6 tabs (60mg ) on day 1, 5 tabs (50mg ) on day 2, 4 tabs (40mg ) on day 3, 3 tabs (30mg ) on day 4, 2 tabs (20mg ) on day 5, and 1 tab (10mg ) on day 6. 03/25/17   Joy, Hillard Danker, PA-C    Family History History reviewed. No pertinent family history.  Social History Social History   Tobacco Use  . Smoking status: Current Some Day Smoker    Years: 5.00    Types: Cigarettes  . Smokeless tobacco: Never Used  Substance Use Topics  . Alcohol use: Yes    Alcohol/week: 0.0 oz  . Drug use: No     Allergies   Patient has  no known allergies.   Review of Systems Review of Systems  Constitutional: Positive for fatigue. Negative for activity change, appetite change, chills, diaphoresis and fever.  HENT: Positive for congestion, postnasal drip and rhinorrhea. Negative for ear discharge, ear pain, facial swelling, mouth sores, nosebleeds, sinus pressure, sinus pain, sneezing, sore throat and trouble swallowing.   Eyes: Negative for pain, discharge, redness and itching.  Respiratory: Positive for cough and wheezing. Negative for chest tightness and shortness of breath.   Cardiovascular: Negative for chest pain and palpitations.  Gastrointestinal: Negative for abdominal pain, diarrhea, nausea and vomiting.    Genitourinary: Negative for decreased urine volume and difficulty urinating.  Musculoskeletal: Negative for arthralgias, back pain, myalgias, neck pain and neck stiffness.  Skin: Positive for rash. Negative for wound.  Neurological: Negative for dizziness, tremors, seizures, syncope, weakness, light-headedness, numbness and headaches.  Hematological: Negative for adenopathy. Does not bruise/bleed easily.     Physical Exam Triage Vital Signs ED Triage Vitals [04/08/17 1331]  Enc Vitals Group     BP (!) 191/108     Pulse Rate 66     Resp 18     Temp 98.5 F (36.9 C)     Temp Source Oral     SpO2 100 %     Weight      Height      Head Circumference      Peak Flow      Pain Score      Pain Loc      Pain Edu?      Excl. in GC?    No data found.  Updated Vital Signs BP (!) 191/108 (BP Location: Left Arm)   Pulse 66   Temp 98.5 F (36.9 C) (Oral)   Resp 18   SpO2 100%   Visual Acuity Right Eye Distance:   Left Eye Distance:   Bilateral Distance:    Right Eye Near:   Left Eye Near:    Bilateral Near:     Physical Exam  Constitutional: He is oriented to person, place, and time. He appears well-developed and well-nourished. He does not appear ill. No distress.  Blood pressure very elevated but asymptomatic.   HENT:  Head: Normocephalic and atraumatic.  Right Ear: Hearing, tympanic membrane, external ear and ear canal normal.  Left Ear: Hearing, tympanic membrane, external ear and ear canal normal.  Nose: Mucosal edema and rhinorrhea present. Right sinus exhibits no maxillary sinus tenderness and no frontal sinus tenderness. Left sinus exhibits no maxillary sinus tenderness and no frontal sinus tenderness.  Mouth/Throat: Uvula is midline, oropharynx is clear and moist and mucous membranes are normal.  Eyes: Conjunctivae and EOM are normal. Right eye exhibits no discharge. Left eye exhibits no discharge.  Neck: Normal range of motion. Neck supple.  Cardiovascular:  Normal rate, regular rhythm and normal heart sounds.  No murmur heard. Pulmonary/Chest: Effort normal. No stridor. No respiratory distress. He has decreased breath sounds in the right upper field, the right lower field, the left upper field and the left lower field. He has wheezes in the right upper field and the left upper field. He has no rhonchi. He has no rales.  Musculoskeletal: Normal range of motion.  Lymphadenopathy:    He has no cervical adenopathy.  Neurological: He is alert and oriented to person, place, and time.  Skin: Skin is warm, dry and intact. Capillary refill takes less than 2 seconds. Rash noted. Rash is maculopapular.     Scattered pink maculopapular lesions  present randomly on forearm, left knee, upper back and neck. Non-tender. No crusting or discharge. No surrounding erythema.   Psychiatric: Thought content normal. His mood appears anxious. His speech is rapid and/or pressured. He is aggressive. Cognition and memory are normal.     UC Treatments / Results  Labs (all labs ordered are listed, but only abnormal results are displayed) Labs Reviewed - No data to display  EKG  EKG Interpretation None       Radiology No results found.  Procedures Procedures (including critical care time)  Medications Ordered in UC Medications - No data to display   Initial Impression / Assessment and Plan / UC Course  I have reviewed the triage vital signs and the nursing notes.  Pertinent labs & imaging results that were available during my care of the patient were reviewed by me and considered in my medical decision making (see chart for details).    Reviewed that a few lesions/rash appear like a mild folliculitis but others are more urticarial. Discussed doubt rash/bites are scabies or bed bugs since no distinct pattern and they do not itch. Reviewed various treatment options for cough and rash. Since patient can only afford <$30 of medications, recommend start  Doxycycline 100mg  twice a day for 10 days. Recommend start Prednisone 10mg  tablets- take 6 tablets today then decrease by 1 tablet each day until finished on day 6 (fill the written Rx he has from his ER visit). Discussed that Prednisone may increase his blood pressure even more and it is very elevated today. Continue to monitor. Patient indicates that blood pressure usually runs 180-190/100/110. Briefly discussed that he needs medications/treatments. May continue Benadryl as needed to help reduce lesions. May take Tessalon cough pills- 1 every 8 hours as needed for cough. If he is able to afford it, recommend fill Rx for Albuterol 2 puffs every 6 hours as needed for wheezing (from ER visit). Strongly encouraged to stop smoking. Gave written info regarding [facility] - to call and make appointment ASAP for HTN management as well as further evaluation.     Final Clinical Impressions(s) / UC Diagnoses   Final diagnoses:  Cough  Insect bite, initial encounter  Rash and nonspecific skin eruption  Elevated blood pressure reading with diagnosis of hypertension    ED Discharge Orders        Ordered    benzonatate (TESSALON) 100 MG capsule  3 times daily PRN     04/08/17 1422    doxycycline (VIBRAMYCIN) 100 MG capsule  2 times daily     04/08/17 1423       Controlled Substance Prescriptions [place] Controlled Substance Registry consulted? Not Applicable   Sudie Grumblingmyot, Daliana Leverett Berry, NP 04/08/17 2221

## 2017-04-08 NOTE — ED Triage Notes (Signed)
Pt sts cough x 2 months and having red bumps to different places on body

## 2019-06-21 ENCOUNTER — Emergency Department (HOSPITAL_COMMUNITY): Payer: No Typology Code available for payment source

## 2019-06-21 ENCOUNTER — Encounter (HOSPITAL_COMMUNITY): Payer: Self-pay | Admitting: Emergency Medicine

## 2019-06-21 ENCOUNTER — Emergency Department (HOSPITAL_COMMUNITY)
Admission: EM | Admit: 2019-06-21 | Discharge: 2019-06-21 | Disposition: A | Discharge status: Home / Self Care | Payer: No Typology Code available for payment source | Attending: Emergency Medicine | Admitting: Emergency Medicine

## 2019-06-21 DIAGNOSIS — Y92410 Unspecified street and highway as the place of occurrence of the external cause: Secondary | ICD-10-CM | POA: Diagnosis not present

## 2019-06-21 DIAGNOSIS — Y998 Other external cause status: Secondary | ICD-10-CM | POA: Insufficient documentation

## 2019-06-21 DIAGNOSIS — S4992XA Unspecified injury of left shoulder and upper arm, initial encounter: Secondary | ICD-10-CM | POA: Diagnosis present

## 2019-06-21 DIAGNOSIS — F17200 Nicotine dependence, unspecified, uncomplicated: Secondary | ICD-10-CM | POA: Diagnosis not present

## 2019-06-21 DIAGNOSIS — Y9389 Activity, other specified: Secondary | ICD-10-CM | POA: Insufficient documentation

## 2019-06-21 DIAGNOSIS — S42032A Displaced fracture of lateral end of left clavicle, initial encounter for closed fracture: Secondary | ICD-10-CM | POA: Diagnosis not present

## 2019-06-21 DIAGNOSIS — T401X1A Poisoning by heroin, accidental (unintentional), initial encounter: Secondary | ICD-10-CM | POA: Diagnosis not present

## 2019-06-21 LAB — I-STAT CHEM 8, ED
BUN: 9 mg/dL (ref 6–20)
Calcium, Ion: 1.08 mmol/L — ABNORMAL LOW (ref 1.15–1.40)
Chloride: 102 mmol/L (ref 98–111)
Creatinine, Ser: 1 mg/dL (ref 0.61–1.24)
Glucose, Bld: 183 mg/dL — ABNORMAL HIGH (ref 70–99)
HCT: 50 % (ref 39.0–52.0)
Hemoglobin: 17 g/dL (ref 13.0–17.0)
Potassium: 3.7 mmol/L (ref 3.5–5.1)
Sodium: 139 mmol/L (ref 135–145)
TCO2: 25 mmol/L (ref 22–32)

## 2019-06-21 LAB — CBC WITH DIFFERENTIAL/PLATELET
Abs Immature Granulocytes: 0.25 10*3/uL — ABNORMAL HIGH (ref 0.00–0.07)
Basophils Absolute: 0.1 10*3/uL (ref 0.0–0.1)
Basophils Relative: 1 %
Eosinophils Absolute: 0.7 10*3/uL — ABNORMAL HIGH (ref 0.0–0.5)
Eosinophils Relative: 6 %
HCT: 50.5 % (ref 39.0–52.0)
Hemoglobin: 16.9 g/dL (ref 13.0–17.0)
Immature Granulocytes: 2 %
Lymphocytes Relative: 37 %
Lymphs Abs: 4.9 10*3/uL — ABNORMAL HIGH (ref 0.7–4.0)
MCH: 29.4 pg (ref 26.0–34.0)
MCHC: 33.5 g/dL (ref 30.0–36.0)
MCV: 87.8 fL (ref 80.0–100.0)
Monocytes Absolute: 0.9 10*3/uL (ref 0.1–1.0)
Monocytes Relative: 7 %
Neutro Abs: 6.4 10*3/uL (ref 1.7–7.7)
Neutrophils Relative %: 47 %
Platelets: 330 10*3/uL (ref 150–400)
RBC: 5.75 MIL/uL (ref 4.22–5.81)
RDW: 12.1 % (ref 11.5–15.5)
WBC: 13.4 10*3/uL — ABNORMAL HIGH (ref 4.0–10.5)
nRBC: 0 % (ref 0.0–0.2)

## 2019-06-21 LAB — TYPE AND SCREEN
ABO/RH(D): A POS
Antibody Screen: NEGATIVE

## 2019-06-21 LAB — ETHANOL: Alcohol, Ethyl (B): <10 mg/dL (ref <10)

## 2019-06-21 LAB — COMPREHENSIVE METABOLIC PANEL
ALT: 45 U/L — ABNORMAL HIGH (ref 0–44)
AST: 58 U/L — ABNORMAL HIGH (ref 15–41)
Albumin: 3.9 g/dL (ref 3.5–5.0)
Alkaline Phosphatase: 100 U/L (ref 38–126)
Anion gap: 11 (ref 5–15)
BUN: 8 mg/dL (ref 6–20)
CO2: 24 mmol/L (ref 22–32)
Calcium: 8.7 mg/dL — ABNORMAL LOW (ref 8.9–10.3)
Chloride: 104 mmol/L (ref 98–111)
Creatinine, Ser: 1.16 mg/dL (ref 0.61–1.24)
GFR calc Af Amer: >60 mL/min (ref >60)
GFR calc non Af Amer: >60 mL/min (ref >60)
Glucose, Bld: 187 mg/dL — ABNORMAL HIGH (ref 70–99)
Potassium: 4 mmol/L (ref 3.5–5.1)
Sodium: 139 mmol/L (ref 135–145)
Total Bilirubin: 0.5 mg/dL (ref 0.3–1.2)
Total Protein: 7 g/dL (ref 6.5–8.1)

## 2019-06-21 LAB — PROTIME-INR
INR: 1 (ref 0.8–1.2)
Prothrombin Time: 12.4 seconds (ref 11.4–15.2)

## 2019-06-21 LAB — ABO/RH: ABO/RH(D): A POS

## 2019-06-21 MED ORDER — IOHEXOL 300 MG/ML  SOLN
100.0000 mL | Freq: Once | INTRAMUSCULAR | Status: AC | PRN
  Administered 2019-06-21: 100 mL via INTRAVENOUS
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
## 2019-06-21 NOTE — Discharge Instructions (Addendum)
Return for any problem.  Follow-up with your regular care provider as instructed.  Follow up with orthopedics as instructed.  Wear sling for comfort.  Use Tylenol as instructed for pain control.
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
## 2019-06-22 ENCOUNTER — Encounter (HOSPITAL_COMMUNITY): Payer: Self-pay | Admitting: Emergency Medicine

## 2019-06-25 ENCOUNTER — Encounter: Payer: Self-pay | Admitting: Orthopaedic Surgery

## 2019-06-25 ENCOUNTER — Other Ambulatory Visit: Payer: Self-pay

## 2019-06-25 ENCOUNTER — Ambulatory Visit (INDEPENDENT_AMBULATORY_CARE_PROVIDER_SITE_OTHER): Payer: Self-pay | Admitting: Orthopaedic Surgery

## 2019-06-25 DIAGNOSIS — S42022A Displaced fracture of shaft of left clavicle, initial encounter for closed fracture: Secondary | ICD-10-CM

## 2019-06-25 MED ORDER — HYDROCODONE-ACETAMINOPHEN 5-325 MG PO TABS: 1.0000 | ORAL_TABLET | Freq: Four times a day (QID) | ORAL | 0 refills | Status: DC | PRN
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
## 2019-06-30 ENCOUNTER — Telehealth: Payer: Self-pay | Admitting: Orthopaedic Surgery

## 2019-06-30 MED ORDER — IBUPROFEN 800 MG PO TABS: 800.0000 mg | ORAL_TABLET | Freq: Three times a day (TID) | ORAL | 1 refills | Status: AC | PRN

## 2019-06-30 MED ORDER — TIZANIDINE HCL 4 MG PO TABS: 4.0000 mg | ORAL_TABLET | Freq: Three times a day (TID) | ORAL | 1 refills | Status: AC | PRN

## 2019-06-30 NOTE — Telephone Encounter (Signed)
Pt called stating the pain in his chest,back, and shoulders has gotten worse; pt states it's been harder to sleep and would like to know if he could be prescribed anything to help.   [phone]

## 2019-06-30 NOTE — Telephone Encounter (Signed)
Please advise 

## 2019-06-30 NOTE — Telephone Encounter (Signed)
I sent in some muscle relaxer (Zanaflex) and 800 mg Ibuprofen.

## 2019-07-09 ENCOUNTER — Encounter: Payer: Self-pay | Admitting: Orthopaedic Surgery

## 2019-07-09 ENCOUNTER — Telehealth: Payer: Self-pay | Admitting: Orthopaedic Surgery

## 2019-07-09 ENCOUNTER — Ambulatory Visit (INDEPENDENT_AMBULATORY_CARE_PROVIDER_SITE_OTHER): Payer: Self-pay | Admitting: Orthopaedic Surgery

## 2019-07-09 ENCOUNTER — Ambulatory Visit (INDEPENDENT_AMBULATORY_CARE_PROVIDER_SITE_OTHER): Payer: Self-pay

## 2019-07-09 ENCOUNTER — Other Ambulatory Visit: Payer: Self-pay

## 2019-07-09 DIAGNOSIS — S42022A Displaced fracture of shaft of left clavicle, initial encounter for closed fracture: Secondary | ICD-10-CM

## 2019-07-09 DIAGNOSIS — S42022D Displaced fracture of shaft of left clavicle, subsequent encounter for fracture with routine healing: Secondary | ICD-10-CM

## 2019-07-09 MED ORDER — HYDROCODONE-ACETAMINOPHEN 5-325 MG PO TABS: 1.0000 | ORAL_TABLET | Freq: Four times a day (QID) | ORAL | 0 refills | Status: DC | PRN

## 2019-07-09 NOTE — Telephone Encounter (Signed)
Pt would like his rx's sent to the [facility] on [address] and [address]
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
## 2019-07-09 NOTE — Telephone Encounter (Signed)
Patient aware we sent it into [facility] already, he said that was fine

## 2019-08-06 ENCOUNTER — Encounter: Payer: Self-pay | Admitting: Orthopaedic Surgery

## 2019-08-06 ENCOUNTER — Ambulatory Visit (INDEPENDENT_AMBULATORY_CARE_PROVIDER_SITE_OTHER): Payer: Self-pay | Admitting: Orthopaedic Surgery

## 2019-08-06 ENCOUNTER — Ambulatory Visit (INDEPENDENT_AMBULATORY_CARE_PROVIDER_SITE_OTHER): Payer: Self-pay

## 2019-08-06 ENCOUNTER — Other Ambulatory Visit: Payer: Self-pay

## 2019-08-06 DIAGNOSIS — S42022D Displaced fracture of shaft of left clavicle, subsequent encounter for fracture with routine healing: Secondary | ICD-10-CM

## 2019-08-06 MED ORDER — HYDROCODONE-ACETAMINOPHEN 5-325 MG PO TABS: 1.0000 | ORAL_TABLET | Freq: Four times a day (QID) | ORAL | 0 refills | Status: AC | PRN
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
## 2019-09-17 ENCOUNTER — Ambulatory Visit: Payer: Self-pay | Admitting: Orthopaedic Surgery

## 2020-08-05 DEATH — deceased
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
